# Patient Record
Sex: Female | Born: 1980 | Race: White | Hispanic: No | Marital: Married | Smoking: Former smoker
Health system: Southern US, Community
[De-identification: ages and names within clinical notes are randomized; demographics above are authoritative.]

## PROBLEM LIST (undated history)

## (undated) DIAGNOSIS — F32A Depression, unspecified: Secondary | ICD-10-CM

## (undated) DIAGNOSIS — R3915 Urgency of urination: Secondary | ICD-10-CM

## (undated) DIAGNOSIS — F419 Anxiety disorder, unspecified: Secondary | ICD-10-CM

## (undated) DIAGNOSIS — N201 Calculus of ureter: Secondary | ICD-10-CM

## (undated) DIAGNOSIS — R350 Frequency of micturition: Secondary | ICD-10-CM

## (undated) DIAGNOSIS — F329 Major depressive disorder, single episode, unspecified: Secondary | ICD-10-CM

## (undated) HISTORY — DX: Major depressive disorder, single episode, unspecified: F32.9

## (undated) HISTORY — DX: Anxiety disorder, unspecified: F41.9

## (undated) HISTORY — PX: TYMPANOSTOMY TUBE PLACEMENT: SHX32

## (undated) HISTORY — DX: Depression, unspecified: F32.A

## (undated) HISTORY — PX: INGUINAL HERNIA REPAIR: SUR1180

---
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
## 2005-04-25 ENCOUNTER — Other Ambulatory Visit: Admission: RE | Admit: 2005-04-25 | Discharge: 2005-04-25 | Payer: Self-pay | Admitting: Family Medicine

## 2006-02-14 HISTORY — PX: TONSILLECTOMY: SUR1361

## 2010-01-18 ENCOUNTER — Inpatient Hospital Stay (HOSPITAL_COMMUNITY)
Admission: AD | Admit: 2010-01-18 | Discharge: 2010-01-20 | Payer: Self-pay | Source: Home / Self Care | Attending: Obstetrics and Gynecology | Admitting: Obstetrics and Gynecology

## 2010-04-27 LAB — CBC
Hemoglobin: 9.5 g/dL — ABNORMAL LOW (ref 12.0–15.0)
MCH: 32.3 pg (ref 26.0–34.0)
MCV: 94.1 fL (ref 78.0–100.0)
Platelets: 163 10*3/uL (ref 150–400)
Platelets: 189 10*3/uL (ref 150–400)
RBC: 2.93 MIL/uL — ABNORMAL LOW (ref 3.87–5.11)
RBC: 3.87 MIL/uL (ref 3.87–5.11)
RDW: 14.6 % (ref 11.5–15.5)
WBC: 12.4 10*3/uL — ABNORMAL HIGH (ref 4.0–10.5)
WBC: 9.3 10*3/uL (ref 4.0–10.5)

## 2010-04-27 LAB — RPR: RPR Ser Ql: NONREACTIVE

## 2011-07-01 ENCOUNTER — Telehealth: Payer: Self-pay | Admitting: Obstetrics and Gynecology

## 2011-07-02 NOTE — Telephone Encounter (Signed)
Error

## 2012-02-09 ENCOUNTER — Ambulatory Visit (INDEPENDENT_AMBULATORY_CARE_PROVIDER_SITE_OTHER): Payer: BC Managed Care – PPO | Admitting: Emergency Medicine

## 2012-02-09 VITALS — BP 100/62 | HR 64 | Temp 98.3°F | Resp 16 | Ht 63.5 in | Wt 122.4 lb

## 2012-02-09 DIAGNOSIS — IMO0002 Reserved for concepts with insufficient information to code with codable children: Secondary | ICD-10-CM

## 2012-02-09 DIAGNOSIS — M79609 Pain in unspecified limb: Secondary | ICD-10-CM

## 2012-02-09 MED ORDER — MUPIROCIN CALCIUM 2 % EX CREA: TOPICAL_CREAM | Freq: Three times a day (TID) | CUTANEOUS | Status: DC

## 2012-02-09 MED ORDER — CEPHALEXIN 500 MG PO TABS: 500.0000 mg | ORAL_TABLET | Freq: Two times a day (BID) | ORAL | Status: DC

## 2012-02-09 NOTE — Progress Notes (Signed)
Verbal consent obtained.  Metacarpal block with 3.5 cc 2% lidocaine plain.  SP&D.  Granulomatous tissue removed with 15 blade.  No purulence.  Hemostasis achieved with pressure.  Cleansed.  Dressing applied.

## 2012-02-09 NOTE — Progress Notes (Signed)
  Subjective:    Patient ID: Christina [name], female    DOB: 1980/08/21, 31 y.o.   MRN: [id number]  HPI Pt presents to clinic today with infection of her Rt index finger. She states that she gets finger infections frequently because she pulls at her cuticles.    Review of Systems     Objective:   Physical Exam examination reveals a 2 x 4 mm developing granuloma at the base of the nail. There is minimal surrounding soft tissue swelling in this area.       Assessment & Plan:  Patient here with a finger paronychia with secondary hygienic granuloma. The tissue will be removed after anesthesia. We'll treat with Bactroban and cephalexin. Wound culture has been ordered .

## 2012-02-09 NOTE — Patient Instructions (Addendum)
Paronychia  Paronychia is an inflammatory reaction involving the folds of the skin surrounding the fingernail. This is commonly caused by an infection in the skin around a nail. The most common cause of paronychia is frequent wetting of the hands (as seen with bartenders, food servers, nurses or others who wet their hands). This makes the skin around the fingernail susceptible to infection by bacteria (germs) or fungus. Other predisposing factors are:   Aggressive manicuring.   Nail biting.   Thumb sucking.  The most common cause is a staphylococcal (a type of germ) infection, or a fungal (Candida) infection. When caused by a germ, it usually comes on suddenly with redness, swelling, pus and is often painful. It may get under the nail and form an abscess (collection of pus), or form an abscess around the nail. If the nail itself is infected with a fungus, the treatment is usually prolonged and may require oral medicine for up to one year. Your caregiver will determine the length of time treatment is required. The paronychia caused by bacteria (germs) may largely be avoided by not pulling on hangnails or picking at cuticles. When the infection occurs at the tips of the finger it is called felon. When the cause of paronychia is from the herpes simplex virus (HSV) it is called herpetic whitlow.  TREATMENT   When an abscess is present treatment is often incision and drainage. This means that the abscess must be cut open so the pus can get out. When this is done, the following home care instructions should be followed.  HOME CARE INSTRUCTIONS    It is important to keep the affected fingers very dry. Rubber or plastic gloves over cotton gloves should be used whenever the hand must be placed in water.   Keep wound clean, dry and dressed as suggested by your caregiver between warm soaks or warm compresses.   Soak in warm water for fifteen to twenty minutes three to four times per day for bacterial infections. Fungal  infections are very difficult to treat, so often require treatment for long periods of time.   For bacterial (germ) infections take antibiotics (medicine which kill germs) as directed and finish the prescription, even if the problem appears to be solved before the medicine is gone.   Only take over-the-counter or prescription medicines for pain, discomfort, or fever as directed by your caregiver.  SEEK IMMEDIATE MEDICAL CARE IF:   You have redness, swelling, or increasing pain in the wound.   You notice pus coming from the wound.   You have a fever.   You notice a bad smell coming from the wound or dressing.  Document Released: 07/27/2000 Document Revised: 04/25/2011 Document Reviewed: 03/28/2008  [employer] Patient Information 2013 [employer].

## 2012-02-09 NOTE — Progress Notes (Deleted)
  Subjective:    Patient ID: Christina [name], female    DOB: 1980/11/09, 31 y.o.   MRN: [id number]  HPI    Review of Systems     Objective:   Physical Exam        Assessment & Plan:

## 2012-02-10 ENCOUNTER — Encounter: Payer: Self-pay | Admitting: Emergency Medicine

## 2012-02-11 LAB — WOUND CULTURE: Gram Stain: NONE SEEN

## 2012-03-13 ENCOUNTER — Emergency Department (HOSPITAL_COMMUNITY): Payer: BC Managed Care – PPO

## 2012-03-13 ENCOUNTER — Emergency Department (HOSPITAL_COMMUNITY)
Admission: EM | Admit: 2012-03-13 | Discharge: 2012-03-13 | Disposition: A | Discharge status: Home / Self Care | Payer: BC Managed Care – PPO | Attending: Emergency Medicine | Admitting: Emergency Medicine

## 2012-03-13 ENCOUNTER — Encounter (HOSPITAL_COMMUNITY): Payer: Self-pay | Admitting: *Deleted

## 2012-03-13 DIAGNOSIS — Z3202 Encounter for pregnancy test, result negative: Secondary | ICD-10-CM | POA: Insufficient documentation

## 2012-03-13 DIAGNOSIS — N1330 Unspecified hydronephrosis: Secondary | ICD-10-CM | POA: Insufficient documentation

## 2012-03-13 DIAGNOSIS — R300 Dysuria: Secondary | ICD-10-CM | POA: Insufficient documentation

## 2012-03-13 DIAGNOSIS — R197 Diarrhea, unspecified: Secondary | ICD-10-CM | POA: Insufficient documentation

## 2012-03-13 DIAGNOSIS — R112 Nausea with vomiting, unspecified: Secondary | ICD-10-CM | POA: Insufficient documentation

## 2012-03-13 DIAGNOSIS — F411 Generalized anxiety disorder: Secondary | ICD-10-CM | POA: Insufficient documentation

## 2012-03-13 DIAGNOSIS — N132 Hydronephrosis with renal and ureteral calculous obstruction: Secondary | ICD-10-CM

## 2012-03-13 DIAGNOSIS — N898 Other specified noninflammatory disorders of vagina: Secondary | ICD-10-CM | POA: Insufficient documentation

## 2012-03-13 DIAGNOSIS — Z79899 Other long term (current) drug therapy: Secondary | ICD-10-CM | POA: Insufficient documentation

## 2012-03-13 DIAGNOSIS — N201 Calculus of ureter: Secondary | ICD-10-CM | POA: Insufficient documentation

## 2012-03-13 DIAGNOSIS — Z87891 Personal history of nicotine dependence: Secondary | ICD-10-CM | POA: Insufficient documentation

## 2012-03-13 LAB — URINALYSIS, ROUTINE W REFLEX MICROSCOPIC
Bilirubin Urine: NEGATIVE
Glucose, UA: NEGATIVE mg/dL
Ketones, ur: 40 mg/dL — AB
Protein, ur: 30 mg/dL — AB
Urobilinogen, UA: 0.2 mg/dL (ref 0.0–1.0)

## 2012-03-13 LAB — COMPREHENSIVE METABOLIC PANEL
Albumin: 4.3 g/dL (ref 3.5–5.2)
BUN: 15 mg/dL (ref 6–23)
CO2: 25 mEq/L (ref 19–32)
Calcium: 8.7 mg/dL (ref 8.4–10.5)
Chloride: 104 mEq/L (ref 96–112)
Creatinine, Ser: 0.85 mg/dL (ref 0.50–1.10)
GFR calc non Af Amer: >90 mL/min (ref >90)
Total Bilirubin: 0.6 mg/dL (ref 0.3–1.2)

## 2012-03-13 LAB — CBC WITH DIFFERENTIAL/PLATELET
Basophils Absolute: 0 10*3/uL (ref 0.0–0.1)
Basophils Relative: 0 % (ref 0–1)
Eosinophils Relative: 0 % (ref 0–5)
HCT: 37.7 % (ref 36.0–46.0)
Hemoglobin: 12.8 g/dL (ref 12.0–15.0)
MCHC: 34 g/dL (ref 30.0–36.0)
MCV: 89.8 fL (ref 78.0–100.0)
Monocytes Absolute: 0.4 10*3/uL (ref 0.1–1.0)
Monocytes Relative: 5 % (ref 3–12)
Neutro Abs: 6.8 10*3/uL (ref 1.7–7.7)
RDW: 14.3 % (ref 11.5–15.5)

## 2012-03-13 LAB — URINE MICROSCOPIC-ADD ON

## 2012-03-13 LAB — WET PREP, GENITAL: Yeast Wet Prep HPF POC: NONE SEEN

## 2012-03-13 LAB — PREGNANCY, URINE: Preg Test, Ur: NEGATIVE

## 2012-03-13 MED ORDER — HYDROMORPHONE HCL PF 1 MG/ML IJ SOLN
0.5000 mg | Freq: Once | INTRAMUSCULAR | Status: AC
  Administered 2012-03-13: 0.5 mg via INTRAVENOUS
  Filled 2012-03-13: qty 1

## 2012-03-13 MED ORDER — SODIUM CHLORIDE 0.9 % IV SOLN
INTRAVENOUS | Status: DC
  Administered 2012-03-13: 18:00:00 via INTRAVENOUS

## 2012-03-13 MED ORDER — ONDANSETRON HCL 4 MG/2ML IJ SOLN
4.0000 mg | Freq: Once | INTRAMUSCULAR | Status: AC
  Administered 2012-03-13: 4 mg via INTRAVENOUS
  Filled 2012-03-13: qty 2

## 2012-03-13 MED ORDER — IBUPROFEN 600 MG PO TABS: 600.0000 mg | ORAL_TABLET | Freq: Four times a day (QID) | ORAL | Status: DC | PRN

## 2012-03-13 MED ORDER — SODIUM CHLORIDE 0.9 % IV BOLUS (SEPSIS)
500.0000 mL | Freq: Once | INTRAVENOUS | Status: AC
  Administered 2012-03-13: 500 mL via INTRAVENOUS

## 2012-03-13 MED ORDER — OXYCODONE-ACETAMINOPHEN 5-325 MG PO TABS: 1.0000 | ORAL_TABLET | Freq: Four times a day (QID) | ORAL | Status: DC | PRN

## 2012-03-13 MED ORDER — TAMSULOSIN HCL 0.4 MG PO CAPS: 0.4000 mg | ORAL_CAPSULE | Freq: Every day | ORAL | Status: DC

## 2012-03-13 MED ORDER — SODIUM CHLORIDE 0.9 % IV BOLUS (SEPSIS)
500.0000 mL | Freq: Once | INTRAVENOUS | Status: AC
  Administered 2012-03-13: 18:00:00 via INTRAVENOUS

## 2012-03-13 MED ORDER — ONDANSETRON 8 MG PO TBDP: 8.0000 mg | ORAL_TABLET | Freq: Three times a day (TID) | ORAL | Status: DC | PRN

## 2012-03-13 NOTE — ED Provider Notes (Signed)
History     CSN: [id number]  Arrival date & time 03/13/12  1633   First MD Initiated Contact with Patient 03/13/12 1644      Chief Complaint  Patient presents with  . Abdominal Pain    (Consider location/radiation/quality/duration/timing/severity/associated sxs/prior treatment) HPI Comments: Patient presents with acute onset of left lower quadrant abdominal pain this morning. Patient describes the pain as waxing and waning, sharp and was initially intermittent. It did not radiate. It has since become more constant. It has not been associated with nausea, vomiting, diarrhea, dysuria. Patient states she is currently on her menstrual period. She does not have any history of surgeries. She took over-the-counter medication at home prior to arrival without any relief. Course is constant. Palpation and movement makes the pain worse. Nothing makes it better.  The history is provided by the patient.    Past Medical History  Diagnosis Date  . Anxiety     Past Surgical History  Procedure Date  . Hernia repair   . Tonsilectomy, adenoidectomy, bilateral myringotomy and tubes     Family History  Problem Relation Age of Onset  . Hypertension Mother     History  Substance Use Topics  . Smoking status: Former Games developer  . Smokeless tobacco: Not on file  . Alcohol Use: No    OB History    Grav Para Term Preterm Abortions TAB SAB Ect Mult Living                  Review of Systems  Constitutional: Negative for fever.  HENT: Negative for sore throat and rhinorrhea.   Eyes: Negative for redness.  Respiratory: Negative for cough.   Cardiovascular: Negative for chest pain.  Gastrointestinal: Positive for abdominal pain. Negative for nausea, vomiting and diarrhea.  Genitourinary: Positive for vaginal bleeding. Negative for dysuria, hematuria, flank pain and vaginal discharge.  Musculoskeletal: Negative for myalgias.  Skin: Negative for rash.  Neurological: Negative for headaches.     Allergies  Codeine and Dimetapp c  Home Medications   Current Outpatient Rx  Name  Route  Sig  Dispense  Refill  . SERTRALINE HCL 25 MG PO TABS   Oral   Take 25 mg by mouth daily.           BP 98/63  Pulse 48  Temp 98.2 F (36.8 C) (Oral)  SpO2 100%  LMP 03/13/2012  Physical Exam  Nursing note and vitals reviewed. Constitutional: She appears well-developed and well-nourished.  HENT:  Head: Normocephalic and atraumatic.  Eyes: Conjunctivae normal are normal. Right eye exhibits no discharge. Left eye exhibits no discharge.  Neck: Normal range of motion. Neck supple.  Cardiovascular: Normal rate, regular rhythm and normal heart sounds.   Pulmonary/Chest: Effort normal and breath sounds normal.  Abdominal: Soft. Bowel sounds are normal. There is tenderness. There is no rigidity, no rebound, no guarding, no CVA tenderness, no tenderness at McBurney's point and negative Murphy's sign.    Genitourinary: Pelvic exam was performed with patient supine. There is no rash or tenderness on the right labia. There is no rash or tenderness on the left labia. Uterus is not tender. Cervix exhibits no motion tenderness and no discharge. Right adnexum displays no mass and no tenderness. Left adnexum displays no mass and no tenderness. There is bleeding (mild) around the vagina. No tenderness around the vagina. No vaginal discharge found.  Neurological: She is alert.  Skin: Skin is warm and dry.  Psychiatric: She has a normal mood and  affect.    ED Course  Procedures (including critical care time)  Labs Reviewed  URINALYSIS, ROUTINE W REFLEX MICROSCOPIC - Abnormal; Notable for the following:    Specific Gravity, Urine 1.031 (*)     Hgb urine dipstick LARGE (*)     Ketones, ur 40 (*)     Protein, ur 30 (*)     All other components within normal limits  CBC WITH DIFFERENTIAL - Abnormal; Notable for the following:    Neutrophils Relative 85 (*)     Lymphocytes Relative 10 (*)      All other components within normal limits  COMPREHENSIVE METABOLIC PANEL - Abnormal; Notable for the following:    Glucose, Bld 111 (*)     All other components within normal limits  WET PREP, GENITAL - Abnormal; Notable for the following:    WBC, Wet Prep HPF POC FEW (*)     All other components within normal limits  URINE MICROSCOPIC-ADD ON - Abnormal; Notable for the following:    Squamous Epithelial / LPF FEW (*)     Casts GRANULAR CAST (*)     All other components within normal limits  PREGNANCY, URINE  URINALYSIS, ROUTINE W REFLEX MICROSCOPIC  GC/CHLAMYDIA PROBE AMP   Ct Abdomen Pelvis Wo Contrast  03/13/2012  *RADIOLOGY REPORT*  Clinical Data: Left flank pain.  Evaluate for stone.  CT ABDOMEN AND PELVIS WITHOUT CONTRAST  Technique:  Multidetector CT imaging of the abdomen and pelvis was performed following the standard protocol without intravenous contrast.  Comparison: None.  Findings: Clear lung bases.  Unenhanced appearance of the liver, spleen, pancreas, gallbladder, and adrenal glands is normal. Normal appearing right kidney.  Marked left hydronephrosis and hydroureter secondary to a partially obstructing 3 x 5 mm distal ureteral calculus (image 77).  Other calcific densities likely represent phleboliths although a second even more distal ureteral stone (image 80) cannot completely be excluded.  No bladder calculi.  Unremarkable osseous structures.  Normal appendix.  No stomach, small bowel, or colon abnormality.  IMPRESSION: Marked left hydronephrosis and hydroureter secondary to a partially obstructing 3 x 5 mm distal ureteral calculus.  See comments above.   Original Report Authenticated By: Davonna Belling, M.D.      1. Ureteral stone with hydronephrosis     5:10 PM Patient seen and examined. Work-up initiated. Medications ordered.   Vital signs reviewed and are as follows: Filed Vitals:   03/13/12 1640  BP: 98/63  Pulse: 48  Temp: 98.2 F (36.8 C)   6:06 PM Pelvic  performed with nurse chaperone. Patient had tenderness with speculum exam, but no tenderness with bimanual.  Given no pelvic tenderness, will order CT to r/o kidney stone.   9:04 PM CT reviewed by myself. Pt informed of results. Patient counseled on kidney stone treatment. Urged patient to strain urine and save any stones. Urged urology follow-up and return to [facility] with any complications. Counseled patient to maintain good fluid intake.   Counseled patient on use of flomax.   Patient counseled on use of narcotic pain medications. Counseled not to combine these medications with others containing tylenol. Urged not to drink alcohol, drive, or perform any other activities that requires focus while taking these medications. The patient verbalizes understanding and agrees with the plan.  Patient appears well, up walking in ED.      MDM  Renal stone with hydro. Pain well controlled in emergency department. She appears well. Tolerating fluids. Kidney function is normal. Urology followup given.  Christina [name], [place] 03/13/12 2106

## 2012-03-13 NOTE — ED Notes (Signed)
Lab sts not enough urine for UA

## 2012-03-13 NOTE — ED Notes (Signed)
Lt lower abd pain since this am with nv and no diarrhea.  lmp now

## 2012-03-13 NOTE — ED Provider Notes (Signed)
Medical screening examination/treatment/procedure(s) were performed by non-physician practitioner and as supervising physician I was immediately available for consultation/collaboration.   Gilda Crease, MD 03/13/12 2300

## 2012-03-13 NOTE — ED Notes (Signed)
Pt c/o lightheadedness during IV dilaudid administration. BP 95/72. 0.25mg  pushed over 2 min. Paused administration BP recheck 102/64 completed 0.5mg  dosage.  Pt sts she no longer feels lightheaded

## 2012-03-13 NOTE — ED Notes (Signed)
PA at bedside.

## 2012-03-14 LAB — GC/CHLAMYDIA PROBE AMP
CT Probe RNA: NEGATIVE
GC Probe RNA: NEGATIVE

## 2012-03-21 ENCOUNTER — Other Ambulatory Visit: Payer: Self-pay | Admitting: Urology

## 2012-03-23 ENCOUNTER — Encounter (HOSPITAL_BASED_OUTPATIENT_CLINIC_OR_DEPARTMENT_OTHER): Payer: Self-pay | Admitting: *Deleted

## 2012-03-23 ENCOUNTER — Other Ambulatory Visit: Payer: Self-pay | Admitting: Urology

## 2012-03-23 NOTE — Progress Notes (Signed)
NPO AFTER MN WITH EXCEPTION CLEAR LIQUIDS UNTIL 0700 (NO CREAM/ MILK PRODUCTS).  CURRENTS LABS IN EPIC AND CHART. MAY TAKE OXYCODONE/ ZOFRAN IF NEEDED W/ SIPS OF WATER AM OF SURG.

## 2012-03-26 ENCOUNTER — Ambulatory Visit (HOSPITAL_COMMUNITY): Payer: BC Managed Care – PPO

## 2012-03-26 ENCOUNTER — Ambulatory Visit (HOSPITAL_BASED_OUTPATIENT_CLINIC_OR_DEPARTMENT_OTHER): Payer: BC Managed Care – PPO | Admitting: Anesthesiology

## 2012-03-26 ENCOUNTER — Ambulatory Visit (HOSPITAL_BASED_OUTPATIENT_CLINIC_OR_DEPARTMENT_OTHER)
Admission: RE | Admit: 2012-03-26 | Discharge: 2012-03-26 | Disposition: A | Discharge status: Home / Self Care | Payer: BC Managed Care – PPO | Source: Ambulatory Visit | Attending: Urology | Admitting: Urology

## 2012-03-26 ENCOUNTER — Encounter (HOSPITAL_BASED_OUTPATIENT_CLINIC_OR_DEPARTMENT_OTHER)
Admission: RE | Disposition: A | Discharge status: Home / Self Care | Payer: Self-pay | Source: Ambulatory Visit | Attending: Urology

## 2012-03-26 ENCOUNTER — Encounter (HOSPITAL_BASED_OUTPATIENT_CLINIC_OR_DEPARTMENT_OTHER): Payer: Self-pay | Admitting: Anesthesiology

## 2012-03-26 ENCOUNTER — Encounter (HOSPITAL_BASED_OUTPATIENT_CLINIC_OR_DEPARTMENT_OTHER): Payer: Self-pay | Admitting: *Deleted

## 2012-03-26 DIAGNOSIS — N201 Calculus of ureter: Secondary | ICD-10-CM | POA: Insufficient documentation

## 2012-03-26 HISTORY — DX: Frequency of micturition: R35.0

## 2012-03-26 HISTORY — PX: CYSTOSCOPY WITH RETROGRADE PYELOGRAM, URETEROSCOPY AND STENT PLACEMENT: SHX5789

## 2012-03-26 HISTORY — DX: Calculus of ureter: N20.1

## 2012-03-26 HISTORY — DX: Urgency of urination: R39.15

## 2012-03-26 HISTORY — PX: HOLMIUM LASER APPLICATION: SHX5852

## 2012-03-26 HISTORY — PX: STONE EXTRACTION WITH BASKET: SHX5318

## 2012-03-26 LAB — POCT PREGNANCY, URINE: Preg Test, Ur: NEGATIVE

## 2012-03-26 SURGERY — CYSTOURETEROSCOPY, WITH RETROGRADE PYELOGRAM AND STENT INSERTION
Anesthesia: General | Site: Ureter | Laterality: Left | Wound class: Clean Contaminated

## 2012-03-26 MED ORDER — SUCCINYLCHOLINE CHLORIDE 20 MG/ML IJ SOLN
INTRAMUSCULAR | Status: DC | PRN
  Administered 2012-03-26 (×3): 20 mg via INTRAVENOUS
  Administered 2012-03-26: 80 mg via INTRAVENOUS

## 2012-03-26 MED ORDER — PROMETHAZINE HCL 25 MG/ML IJ SOLN
6.2500 mg | INTRAMUSCULAR | Status: DC | PRN
  Filled 2012-03-26: qty 1

## 2012-03-26 MED ORDER — CEFAZOLIN SODIUM-DEXTROSE 2-3 GM-% IV SOLR
INTRAVENOUS | Status: DC | PRN
  Administered 2012-03-26: 2 g via INTRAVENOUS

## 2012-03-26 MED ORDER — BELLADONNA ALKALOIDS-OPIUM 16.2-60 MG RE SUPP
RECTAL | Status: DC | PRN
  Administered 2012-03-26: 1 via RECTAL

## 2012-03-26 MED ORDER — DEXAMETHASONE SODIUM PHOSPHATE 4 MG/ML IJ SOLN
INTRAMUSCULAR | Status: DC | PRN
  Administered 2012-03-26: 10 mg via INTRAVENOUS

## 2012-03-26 MED ORDER — HYOSCYAMINE SULFATE 0.125 MG PO TABS
0.1250 mg | ORAL_TABLET | ORAL | Status: DC | PRN
Start: 2012-03-26 — End: 2013-12-17

## 2012-03-26 MED ORDER — CEFAZOLIN SODIUM-DEXTROSE 2-3 GM-% IV SOLR
2.0000 g | INTRAVENOUS | Status: DC
  Filled 2012-03-26: qty 50

## 2012-03-26 MED ORDER — CEPHALEXIN 500 MG PO CAPS: 500.0000 mg | ORAL_CAPSULE | Freq: Three times a day (TID) | ORAL | Status: DC

## 2012-03-26 MED ORDER — LACTATED RINGERS IV SOLN
INTRAVENOUS | Status: DC
  Filled 2012-03-26: qty 1000

## 2012-03-26 MED ORDER — ONDANSETRON HCL 4 MG/2ML IJ SOLN
INTRAMUSCULAR | Status: DC | PRN
  Administered 2012-03-26: 4 mg via INTRAVENOUS

## 2012-03-26 MED ORDER — LIDOCAINE HCL (CARDIAC) 20 MG/ML IV SOLN
INTRAVENOUS | Status: DC | PRN
  Administered 2012-03-26: 100 mg via INTRAVENOUS

## 2012-03-26 MED ORDER — IOHEXOL 350 MG/ML SOLN
INTRAVENOUS | Status: DC | PRN
  Administered 2012-03-26: 5 mL

## 2012-03-26 MED ORDER — LACTATED RINGERS IV SOLN
INTRAVENOUS | Status: DC
  Administered 2012-03-26 (×2): via INTRAVENOUS
  Administered 2012-03-26: 100 mL/h via INTRAVENOUS
  Filled 2012-03-26: qty 1000

## 2012-03-26 MED ORDER — SENNOSIDES-DOCUSATE SODIUM 8.6-50 MG PO TABS: 1.0000 | ORAL_TABLET | Freq: Two times a day (BID) | ORAL | Status: DC

## 2012-03-26 MED ORDER — PROPOFOL 10 MG/ML IV BOLUS
INTRAVENOUS | Status: DC | PRN
  Administered 2012-03-26: 170 mg via INTRAVENOUS
  Administered 2012-03-26: 30 mg via INTRAVENOUS

## 2012-03-26 MED ORDER — LIDOCAINE HCL 2 % EX GEL
CUTANEOUS | Status: DC | PRN
  Administered 2012-03-26: 1 via URETHRAL

## 2012-03-26 MED ORDER — KETOROLAC TROMETHAMINE 30 MG/ML IJ SOLN
INTRAMUSCULAR | Status: DC | PRN
  Administered 2012-03-26: 30 mg via INTRAVENOUS

## 2012-03-26 MED ORDER — GLYCOPYRROLATE 0.2 MG/ML IJ SOLN
INTRAMUSCULAR | Status: DC | PRN
  Administered 2012-03-26: 0.2 mg via INTRAVENOUS

## 2012-03-26 MED ORDER — PHENAZOPYRIDINE HCL 100 MG PO TABS: 100.0000 mg | ORAL_TABLET | Freq: Three times a day (TID) | ORAL | Status: DC | PRN

## 2012-03-26 MED ORDER — FENTANYL CITRATE 0.05 MG/ML IJ SOLN
INTRAMUSCULAR | Status: DC | PRN
  Administered 2012-03-26: 25 ug via INTRAVENOUS
  Administered 2012-03-26: 50 ug via INTRAVENOUS
  Administered 2012-03-26 (×3): 25 ug via INTRAVENOUS
  Administered 2012-03-26: 50 ug via INTRAVENOUS

## 2012-03-26 MED ORDER — FENTANYL CITRATE 0.05 MG/ML IJ SOLN
25.0000 ug | INTRAMUSCULAR | Status: DC | PRN
  Filled 2012-03-26: qty 1

## 2012-03-26 MED ORDER — OXYBUTYNIN CHLORIDE 5 MG PO TABS: 5.0000 mg | ORAL_TABLET | Freq: Four times a day (QID) | ORAL | Status: DC | PRN

## 2012-03-26 MED ORDER — SODIUM CHLORIDE 0.9 % IR SOLN
Status: DC | PRN
  Administered 2012-03-26: 6000 mL via INTRAVESICAL

## 2012-03-26 MED ORDER — MIDAZOLAM HCL 5 MG/5ML IJ SOLN
INTRAMUSCULAR | Status: DC | PRN
  Administered 2012-03-26 (×2): 2 mg via INTRAVENOUS

## 2012-03-26 SURGICAL SUPPLY — 46 items
ADAPTER CATH URET PLST 4-6FR (CATHETERS) IMPLANT
ADPR CATH URET STRL DISP 4-6FR (CATHETERS)
BAG DRAIN URO-CYSTO SKYTR STRL (DRAIN) ×2 IMPLANT
BAG DRN UROCATH (DRAIN) ×1
BASKET LASER NITINOL 1.9FR (BASKET) IMPLANT
BASKET STNLS GEMINI 4WIRE 3FR (BASKET) IMPLANT
BASKET ZERO TIP NITINOL 2.4FR (BASKET) ×1 IMPLANT
BRUSH URET BIOPSY 3F (UROLOGICAL SUPPLIES) IMPLANT
BSKT STON RTRVL 120 1.9FR (BASKET)
BSKT STON RTRVL GEM 120X11 3FR (BASKET)
BSKT STON RTRVL ZERO TP 2.4FR (BASKET) ×1
CANISTER SUCT LVC 12 LTR MEDI- (MISCELLANEOUS) ×1 IMPLANT
CATH CLEAR GEL 3F BACKSTOP (CATHETERS) ×1 IMPLANT
CATH INTERMIT  6FR 70CM (CATHETERS) ×1 IMPLANT
CATH URET 5FR 28IN CONE TIP (BALLOONS)
CATH URET 5FR 28IN OPEN ENDED (CATHETERS) ×1 IMPLANT
CATH URET 5FR 70CM CONE TIP (BALLOONS) IMPLANT
CATH URET DUAL LUMEN 6-10FR 50 (CATHETERS) IMPLANT
CLOTH BEACON ORANGE TIMEOUT ST (SAFETY) ×2 IMPLANT
DRAPE CAMERA CLOSED 9X96 (DRAPES) ×2 IMPLANT
ELECT REM PT RETURN 9FT ADLT (ELECTROSURGICAL)
ELECTRODE REM PT RTRN 9FT ADLT (ELECTROSURGICAL) IMPLANT
GLOVE BIO SURGEON STRL SZ7 (GLOVE) ×2 IMPLANT
GLOVE ECLIPSE 7.0 STRL STRAW (GLOVE) ×2 IMPLANT
GLOVE INDICATOR 7.5 STRL GRN (GLOVE) ×2 IMPLANT
GOWN PREVENTION PLUS LG XLONG (DISPOSABLE) ×2 IMPLANT
GOWN SURGICAL LARGE (GOWNS) ×1 IMPLANT
GOWN SURGICAL XLG (GOWNS) ×1 IMPLANT
GUIDEWIRE 0.038 PTFE COATED (WIRE) IMPLANT
GUIDEWIRE ANG ZIPWIRE 038X150 (WIRE) IMPLANT
GUIDEWIRE STR DUAL SENSOR (WIRE) ×2 IMPLANT
IV NS IRRIG 3000ML ARTHROMATIC (IV SOLUTION) ×3 IMPLANT
KIT BALLIN UROMAX 15FX10 (LABEL) IMPLANT
KIT BALLN UROMAX 15FX4 (MISCELLANEOUS) IMPLANT
KIT BALLN UROMAX 26 75X4 (MISCELLANEOUS)
LASER FIBER DISP (UROLOGICAL SUPPLIES) ×1 IMPLANT
PACK CYSTOSCOPY (CUSTOM PROCEDURE TRAY) ×2 IMPLANT
SET HIGH PRES BAL DIL (LABEL)
SHEATH ACCESS URETERAL 24CM (SHEATH) ×1 IMPLANT
SHEATH ACCESS URETERAL 38CM (SHEATH) IMPLANT
SHEATH ACCESS URETERAL 54CM (SHEATH) IMPLANT
SHEATH URET ACCESS 12FR/35CM (UROLOGICAL SUPPLIES) IMPLANT
SHEATH URET ACCESS 12FR/55CM (UROLOGICAL SUPPLIES) IMPLANT
STENT CONTOUR 6FRX24X.038 (STENTS) IMPLANT
STENT URET 6FRX24 CONTOUR (STENTS) ×1 IMPLANT
SYRINGE IRR TOOMEY STRL 70CC (SYRINGE) IMPLANT

## 2012-03-26 NOTE — H&P (Signed)
Urology History and Physical Exam  CC: Left ureter stone  HPI: 32 year old female presents today for a left ureter stone. She began having abdominal pain 03/13/12. This was located in the left lower quadrant. It did not radiate to the back or the groin. It was dull in nature. It was associated with nausea. It was not associated with fever or emesis. Nothing seems to make it better or worse. She's never had any genitourinary surgery. Her last menstrual period began on Monday, 03/19/12. She reported to the ER on 03/13/12. She was evaluated and found to have a left ureter stone. This is in the distal ureter. It was approximately 3 x 5 mm in size. She had no signs or symptoms of infection in the ER. It is possible that she could have a second more distal left ureter stone, but this is difficult to evaluate on the CT scan. The stone was associated with marked left hydronephrosis and hydroureter. The stone was only partially obstructing.  Initially, she wanted to try to pass the stone, but contacted my office and decided to schedule intervention. She presents today for cystoscopy, left retrograde pyelogram, left ureteroscopy, laser lithotripsy, possible left ureter stent placement.  KUB was obtained in clinic showed several pulsations the left pelvis. One of these is likely her left ureter stone. The patient's symptoms have improved, but she does not know if she passed the stone. A KUB was obtained today which showed the stone still appears to be present in the left distal ureter. I offered to proceed with surgery vs obtaining a CT scan to evaluate for the stone. She wishes to proceed.   PMH: Past Medical History  Diagnosis Date  . Anxiety   . Left ureteral calculus   . Frequency of urination   . Urgency of urination     PSH: Past Surgical History  Procedure Laterality Date  . Inguinal hernia repair  AS CHILD  . Tympanostomy tube placement  23 MONTH OLD    REMOVAL AT AGE 80  . Tonsillectomy  2008     Allergies: Allergies  Allergen Reactions  . Codeine Other (See Comments)    shaking  . Dimetapp C (Phenylephrine-Bromphen-Codeine)     unknown    Medications: No prescriptions prior to admission     Social History: History   Social History  . Marital Status: Married    Spouse Name: N/A    Number of Children: N/A  . Years of Education: N/A   Occupational History  . Not on file.   Social History Main Topics  . Smoking status: Former Smoker -- 0.25 packs/day for 10 years    Types: Cigarettes    Quit date: 03/24/2007  . Smokeless tobacco: Never Used  . Alcohol Use: No  . Drug Use: No  . Sexually Active: Yes    Birth Control/ Protection: Condom   Other Topics Concern  . Not on file   Social History Narrative  . No narrative on file    Family History: Family History  Problem Relation Age of Onset  . Hypertension Mother     Review of Systems: Positive: none. Negative: Chest pain, fever, or SOB.  A further 10 point review of systems was negative except what is listed in the HPI.  Physical Exam: Filed Vitals:   03/26/12 1247  BP: 94/52  Pulse: 54  Temp: 98 F (36.7 C)  Resp: 15    General: No acute distress.  Awake. Head:  Normocephalic.  Atraumatic.  ENT:  EOMI.  Mucous membranes moist Neck:  Supple.  No lymphadenopathy. CV:  S1 present. S2 present. Regular rate. Pulmonary: Equal effort bilaterally.  Clear to auscultation bilaterally. Abdomen: Soft.  Non- tender to palpation. Skin:  Normal turgor.  No visible rash. Extremity: No gross deformity of bilateral upper extremities.  No gross deformity of    bilateral lower extremities. Neurologic: Alert. Appropriate mood.    Studies:  No results found for this basename: HGB, WBC, PLT,  in the last 72 hours  No results found for this basename: NA, K, CL, CO2, BUN, CREATININE, CALCIUM, MAGNESIUM, GFRNONAA, GFRAA,  in the last 72 hours   No results found for this basename: PT, INR, APTT,  in the  last 72 hours   No components found with this basename: ABG,     Assessment:  Left distal ureter stone.  Plan: To OR for cystoscopy, left retrograde pyelogram, left ureteroscopy, laser lithotripsy, possible left ureter stent placement.

## 2012-03-26 NOTE — Anesthesia Postprocedure Evaluation (Signed)
  Anesthesia Post-op Note  Patient: Christina [name]  Procedure(s) Performed: Procedure(s) (LRB): CYSTOSCOPY WITH RETROGRADE PYELOGRAM, URETEROSCOPY AND STENT PLACEMENT (Left) HOLMIUM LASER APPLICATION (Left) STONE EXTRACTION WITH BASKET (Left)  Patient Location: PACU  Anesthesia Type: General  Level of Consciousness: awake and alert   Airway and Oxygen Therapy: Patient Spontanous Breathing  Post-op Pain: mild  Post-op Assessment: Post-op Vital signs reviewed, Patient's Cardiovascular Status Stable, Respiratory Function Stable, Patent Airway and No signs of Nausea or vomiting  Last Vitals:  Filed Vitals:   03/26/12 1530  BP:   Pulse:   Temp: 35.8 C  Resp: 16    Post-op Vital Signs: stable   Complications: No apparent anesthesia complications

## 2012-03-26 NOTE — Anesthesia Preprocedure Evaluation (Signed)
Anesthesia Evaluation  Patient identified by MRN, date of birth, ID band Patient awake    Reviewed: Allergy & Precautions, H&P , NPO status , Patient's Chart, lab work & pertinent test results  Airway Mallampati: I TM Distance: >3 FB Neck ROM: Full    Dental  (+) Teeth Intact and Dental Advisory Given   Pulmonary neg pulmonary ROS,  breath sounds clear to auscultation  Pulmonary exam normal       Cardiovascular negative cardio ROS  Rhythm:Regular Rate:Normal     Neuro/Psych negative neurological ROS  negative psych ROS   GI/Hepatic negative GI ROS, Neg liver ROS,   Endo/Other  negative endocrine ROS  Renal/GU negative Renal ROS  negative genitourinary   Musculoskeletal negative musculoskeletal ROS (+)   Abdominal   Peds  Hematology negative hematology ROS (+)   Anesthesia Other Findings   Reproductive/Obstetrics negative OB ROS                           Anesthesia Physical Anesthesia Plan  ASA: I  Anesthesia Plan: General   Post-op Pain Management:    Induction: Intravenous  Airway Management Planned: LMA  Additional Equipment:   Intra-op Plan:   Post-operative Plan: Extubation in OR  Informed Consent: I have reviewed the patients History and Physical, chart, labs and discussed the procedure including the risks, benefits and alternatives for the proposed anesthesia with the patient or authorized representative who has indicated his/her understanding and acceptance.   Dental advisory given  Plan Discussed with: CRNA  Anesthesia Plan Comments:         Anesthesia Quick Evaluation

## 2012-03-26 NOTE — Op Note (Signed)
Urology Operative Report  Date of Procedure: 03/26/12  Surgeon: Natalia Leatherwood, MD Assistant: None  Preoperative Diagnosis: Left distal ureter stone Postoperative Diagnosis:  Same  Procedure(s): Cystoscopy Left ureteroscopy Laser lithotripsy Basket stone retrieval Left ureter stent placement Left retrograde pyelogram.  Estimated blood loss: Minimal  Specimen: Stone fragments provided to patient.  Drains: None  Complications: None  Findings: Left distal ureter narrowing. Left impacted distal ureter stone.  History of present illness: Is for left distal ureter stone. This was discovered on CT scan. Over the weekend she had decreased symptoms and was concerned that she may have passed her stone, but she never actually saw the fragment. Preoperative KUB showed the stone appeared to be unmoved in position. She wished to proceed with the surgery.    Procedure in detail: After informed consent was obtained, the patient was taken to the operating room. They were placed in the supine position. SCDs were turned on and in place. IV antibiotics were infused, and general anesthesia was induced. A timeout was performed in which the correct patient, surgical site, and procedure were identified and agreed upon by the team.  The patient was placed in a dorsolithotomy position, making sure to pad all pertinent neurovascular pressure points. The genitals were prepped and draped in the usual sterile fashion.  A rigid cystoscope was advanced through the urethra and into the bladder. The bladder was fully distended and evaluated in a systematic fashion with a 12 and 70 lens. There were no lesions noted in the bladder. Attention was turned to the left ureter orifice. It was seen to be effluxing clear yellow urine. I obtained fluoroscopy of the pelvis which showed a calcification in the left pelvis which seemed to correspond with her left distal ureter stone. I then obtained a retrograde pyelogram by  catheterizing the left ureter orifice and I injected contrast. The contrast went up the ureter and surrounded the calcification showing a filling defect. There were no proximal filling defects of the ureter or collecting system. This confirmed that the stone was present in the distal ureter. I then placed a sensor tip wire up the ureter and into the left renal pelvis on fluoroscopy. The bladder was drained.  The patient was then paralyzed and I inserted a semirigid ureteroscope into the bladder, but I could not cannulate the left ureter orifice because it was too narrow. I removed the ureter scope and placed the internal obturator of the 12/14 ureter access sheath over the wire under fluoroscopy. I placed this up just proximal to the stone fluoroscopy with ease. The obturator was then removed and the wire left in place as a safety wire. I was then able to navigate the semirigid ureteroscope up to the left ureter stone. It appeared to be impacted in the ureter. I then injected backstop gel beyond the ureter under fluoroscopy. After this was done a 200  filament holmium laser was used to perform lithotripsy at 0.5 J and 20 Hz. The stone was fragmented into smaller pieces. I then grasped the larger pieces with a 0 tip Nitinol basket and brought these out into the bladder. There was noted to be a small mucosal disruption in the distal ureter from placing the obturator to the ureter access sheath. Because of this and the fact that the stone was impacted I decided to leave a double-J ureter stent in place. The semirigid scope was removed and the cystoscope was loaded over the safety wire. A 6 x 24 double-J stent was placed  up the wire with ease under fluoroscopy. A good curl was deployed in the left renal pelvis and a curl was seen in the bladder on direct visualization. No tethering strings remained in place. The bladder was then emptied of the stone contents and these were placed in a specimen jar. These were taken  to the patient after the surgery. Lidocaine jelly was placed into her urethra and a belladonna and opium suppository was placed into her rectum. She was placed back in a supine position and anesthesia was reversed. This completed the procedure. She was taken to the PACU in stable condition.  She'll return to clinic next week for cystoscopy and left ureter stent removal. She will receive Keflex to cover for this instrumentation.

## 2012-03-26 NOTE — Transfer of Care (Signed)
Immediate Anesthesia Transfer of Care Note  Patient: Christina [name]  Procedure(s) Performed: Procedure(s) (LRB): CYSTOSCOPY WITH RETROGRADE PYELOGRAM, URETEROSCOPY AND STENT PLACEMENT (Left) HOLMIUM LASER APPLICATION (Left) STONE EXTRACTION WITH BASKET (Left)  Patient Location: PACU  Anesthesia Type: General  Level of Consciousness:sleepy  Airway & Oxygen Therapy: Patient Spontanous Breathing and Patient connected to face mask oxygen  Post-op Assessment: Report given to PACU RN and Post -op Vital signs reviewed and stable  Post vital signs: Reviewed and stable  Complications: No apparent anesthesia complications

## 2012-03-26 NOTE — Anesthesia Procedure Notes (Signed)
Procedure Name: LMA Insertion Date/Time: 03/26/2012 2:28 PM Performed by: Jessica Priest Pre-anesthesia Checklist: Patient identified, Emergency Drugs available, Suction available and Patient being monitored Patient Re-evaluated:Patient Re-evaluated prior to inductionOxygen Delivery Method: Circle System Utilized Preoxygenation: Pre-oxygenation with 100% oxygen Intubation Type: IV induction Ventilation: Mask ventilation without difficulty LMA: LMA inserted LMA Size: 4.0 Number of attempts: 1 Airway Equipment and Method: bite block Placement Confirmation: positive ETCO2 Tube secured with: Tape Dental Injury: Teeth and Oropharynx as per pre-operative assessment

## 2012-03-27 ENCOUNTER — Encounter (HOSPITAL_BASED_OUTPATIENT_CLINIC_OR_DEPARTMENT_OTHER): Payer: Self-pay | Admitting: Urology

## 2012-03-27 NOTE — Progress Notes (Signed)
Pt D/C to home w husband via w/c without c/o pain, discomfort @ 1800. IV D/C @ 1745 without complications w infused while in Phase II. Voices no c/o of pain.

## 2013-02-14 NOTE — L&D Delivery Note (Signed)
Delivery Note At 8:29 AM a viable female was delivered via  (Presentation: ;  ).  APGAR: , ; weight  .   Placenta status:spont>>intact , .  Cord:  with the following complications: .  Cord pH: noty sent  Anesthesia: Epidural  Episiotomy:  none Lacerations:  Sec deg Suture Repair: 3.0 chromic Est. Blood Loss (mL):  400 rec'd IV pit + massage + 0.2 methergine for atony with improvement Mom to postpartum.  Baby to Couplet care / Skin to Skin.  Christina [name] M 12/17/2013, 8:41 AM

## 2013-05-07 LAB — OB RESULTS CONSOLE RUBELLA ANTIBODY, IGM: RUBELLA: IMMUNE

## 2013-05-07 LAB — OB RESULTS CONSOLE HEPATITIS B SURFACE ANTIGEN: Hepatitis B Surface Ag: NEGATIVE

## 2013-05-07 LAB — OB RESULTS CONSOLE ABO/RH: RH Type: POSITIVE

## 2013-05-07 LAB — OB RESULTS CONSOLE GC/CHLAMYDIA
Chlamydia: NEGATIVE
GC PROBE AMP, GENITAL: NEGATIVE

## 2013-05-07 LAB — OB RESULTS CONSOLE HIV ANTIBODY (ROUTINE TESTING): HIV: NONREACTIVE

## 2013-05-07 LAB — OB RESULTS CONSOLE ANTIBODY SCREEN: ANTIBODY SCREEN: NEGATIVE

## 2013-05-07 LAB — OB RESULTS CONSOLE RPR: RPR: NONREACTIVE

## 2013-05-30 ENCOUNTER — Other Ambulatory Visit (HOSPITAL_COMMUNITY): Payer: Self-pay | Admitting: Obstetrics and Gynecology

## 2013-05-30 DIAGNOSIS — IMO0002 Reserved for concepts with insufficient information to code with codable children: Secondary | ICD-10-CM

## 2013-05-30 DIAGNOSIS — Z0489 Encounter for examination and observation for other specified reasons: Secondary | ICD-10-CM

## 2013-07-11 ENCOUNTER — Ambulatory Visit (HOSPITAL_COMMUNITY): Payer: BC Managed Care – PPO

## 2013-12-13 ENCOUNTER — Telehealth (HOSPITAL_COMMUNITY): Payer: Self-pay | Admitting: *Deleted

## 2013-12-13 ENCOUNTER — Encounter (HOSPITAL_COMMUNITY): Payer: Self-pay | Admitting: *Deleted

## 2013-12-13 LAB — OB RESULTS CONSOLE GBS: STREP GROUP B AG: POSITIVE

## 2013-12-13 NOTE — Telephone Encounter (Signed)
Preadmission screen  

## 2013-12-16 ENCOUNTER — Encounter (HOSPITAL_COMMUNITY): Payer: Self-pay | Admitting: *Deleted

## 2013-12-17 ENCOUNTER — Inpatient Hospital Stay (HOSPITAL_COMMUNITY)
Admission: AD | Admit: 2013-12-17 | Discharge: 2013-12-19 | DRG: 775 | Disposition: A | Discharge status: Home / Self Care | Payer: BC Managed Care – PPO | Source: Ambulatory Visit | Attending: Obstetrics and Gynecology | Admitting: Obstetrics and Gynecology

## 2013-12-17 ENCOUNTER — Encounter (HOSPITAL_COMMUNITY): Payer: Self-pay | Admitting: *Deleted

## 2013-12-17 ENCOUNTER — Inpatient Hospital Stay (HOSPITAL_COMMUNITY): Payer: BC Managed Care – PPO | Admitting: Anesthesiology

## 2013-12-17 DIAGNOSIS — Z87891 Personal history of nicotine dependence: Secondary | ICD-10-CM | POA: Diagnosis not present

## 2013-12-17 DIAGNOSIS — Z3A40 40 weeks gestation of pregnancy: Secondary | ICD-10-CM | POA: Diagnosis present

## 2013-12-17 DIAGNOSIS — Z3483 Encounter for supervision of other normal pregnancy, third trimester: Secondary | ICD-10-CM | POA: Diagnosis present

## 2013-12-17 DIAGNOSIS — IMO0001 Reserved for inherently not codable concepts without codable children: Secondary | ICD-10-CM

## 2013-12-17 LAB — TYPE AND SCREEN
ABO/RH(D): O POS
ANTIBODY SCREEN: NEGATIVE

## 2013-12-17 LAB — CBC
HEMATOCRIT: 36.8 % (ref 36.0–46.0)
HEMOGLOBIN: 12.3 g/dL (ref 12.0–15.0)
MCH: 31.7 pg (ref 26.0–34.0)
MCHC: 33.4 g/dL (ref 30.0–36.0)
MCV: 94.8 fL (ref 78.0–100.0)
Platelets: 211 10*3/uL (ref 150–400)
RBC: 3.88 MIL/uL (ref 3.87–5.11)
RDW: 14.6 % (ref 11.5–15.5)
WBC: 10.7 10*3/uL — AB (ref 4.0–10.5)

## 2013-12-17 LAB — RPR

## 2013-12-17 LAB — POCT FERN TEST: POCT FERN TEST: POSITIVE

## 2013-12-17 LAB — ABO/RH: ABO/RH(D): O POS

## 2013-12-17 LAB — RAPID HIV SCREEN (WH-MAU): SUDS RAPID HIV SCREEN: NONREACTIVE

## 2013-12-17 MED ORDER — PENICILLIN G POTASSIUM 5000000 UNITS IJ SOLR
5.0000 10*6.[IU] | Freq: Once | INTRAMUSCULAR | Status: AC
  Administered 2013-12-17: 5 10*6.[IU] via INTRAVENOUS
  Filled 2013-12-17: qty 5

## 2013-12-17 MED ORDER — ZOLPIDEM TARTRATE 5 MG PO TABS: 5.0000 mg | ORAL_TABLET | Freq: Every evening | ORAL | Status: DC | PRN

## 2013-12-17 MED ORDER — WITCH HAZEL-GLYCERIN EX PADS: 1.0000 "application " | MEDICATED_PAD | CUTANEOUS | Status: DC | PRN

## 2013-12-17 MED ORDER — IBUPROFEN 800 MG PO TABS
800.0000 mg | ORAL_TABLET | Freq: Three times a day (TID) | ORAL | Status: DC | PRN
  Administered 2013-12-18 (×2): 800 mg via ORAL
  Filled 2013-12-17 (×3): qty 1

## 2013-12-17 MED ORDER — TETANUS-DIPHTH-ACELL PERTUSSIS 5-2.5-18.5 LF-MCG/0.5 IM SUSP: 0.5000 mL | Freq: Once | INTRAMUSCULAR | Status: DC

## 2013-12-17 MED ORDER — DIBUCAINE 1 % RE OINT: 1.0000 "application " | TOPICAL_OINTMENT | RECTAL | Status: DC | PRN

## 2013-12-17 MED ORDER — OXYCODONE-ACETAMINOPHEN 5-325 MG PO TABS: 1.0000 | ORAL_TABLET | ORAL | Status: DC | PRN

## 2013-12-17 MED ORDER — PRENATAL MULTIVITAMIN CH
1.0000 | ORAL_TABLET | Freq: Every day | ORAL | Status: DC
  Filled 2013-12-17: qty 1

## 2013-12-17 MED ORDER — SENNOSIDES-DOCUSATE SODIUM 8.6-50 MG PO TABS
2.0000 | ORAL_TABLET | ORAL | Status: DC
  Administered 2013-12-18 – 2013-12-19 (×2): 2 via ORAL
  Filled 2013-12-17 (×2): qty 2

## 2013-12-17 MED ORDER — DIPHENHYDRAMINE HCL 25 MG PO CAPS: 25.0000 mg | ORAL_CAPSULE | Freq: Four times a day (QID) | ORAL | Status: DC | PRN

## 2013-12-17 MED ORDER — EPHEDRINE 5 MG/ML INJ
10.0000 mg | INTRAVENOUS | Status: DC | PRN
  Filled 2013-12-17: qty 2

## 2013-12-17 MED ORDER — OXYTOCIN 40 UNITS IN LACTATED RINGERS INFUSION - SIMPLE MED
62.5000 mL/h | INTRAVENOUS | Status: DC
  Administered 2013-12-17: 62.5 mL/h via INTRAVENOUS
  Filled 2013-12-17: qty 1000

## 2013-12-17 MED ORDER — ONDANSETRON HCL 4 MG PO TABS: 4.0000 mg | ORAL_TABLET | ORAL | Status: DC | PRN

## 2013-12-17 MED ORDER — FENTANYL 2.5 MCG/ML BUPIVACAINE 1/10 % EPIDURAL INFUSION (WH - ANES)
INTRAMUSCULAR | Status: DC | PRN
  Administered 2013-12-17: 14 mL/h via EPIDURAL

## 2013-12-17 MED ORDER — MEASLES, MUMPS & RUBELLA VAC ~~LOC~~ INJ: 0.5000 mL | INJECTION | Freq: Once | SUBCUTANEOUS | Status: DC

## 2013-12-17 MED ORDER — LIDOCAINE HCL (PF) 1 % IJ SOLN
30.0000 mL | INTRAMUSCULAR | Status: DC | PRN
  Filled 2013-12-17: qty 30

## 2013-12-17 MED ORDER — ONDANSETRON HCL 4 MG/2ML IJ SOLN: 4.0000 mg | Freq: Four times a day (QID) | INTRAMUSCULAR | Status: DC | PRN

## 2013-12-17 MED ORDER — FLEET ENEMA 7-19 GM/118ML RE ENEM: 1.0000 | ENEMA | RECTAL | Status: DC | PRN

## 2013-12-17 MED ORDER — OXYCODONE-ACETAMINOPHEN 5-325 MG PO TABS: 2.0000 | ORAL_TABLET | ORAL | Status: DC | PRN

## 2013-12-17 MED ORDER — BISACODYL 10 MG RE SUPP: 10.0000 mg | Freq: Every day | RECTAL | Status: DC | PRN

## 2013-12-17 MED ORDER — ONDANSETRON HCL 4 MG/2ML IJ SOLN: 4.0000 mg | INTRAMUSCULAR | Status: DC | PRN

## 2013-12-17 MED ORDER — CITRIC ACID-SODIUM CITRATE 334-500 MG/5ML PO SOLN
30.0000 mL | ORAL | Status: DC | PRN
Start: 2013-12-17 — End: 2013-12-17

## 2013-12-17 MED ORDER — PHENYLEPHRINE 40 MCG/ML (10ML) SYRINGE FOR IV PUSH (FOR BLOOD PRESSURE SUPPORT)
80.0000 ug | PREFILLED_SYRINGE | INTRAVENOUS | Status: DC | PRN
  Administered 2013-12-17: 80 ug via INTRAVENOUS
  Filled 2013-12-17: qty 2

## 2013-12-17 MED ORDER — LACTATED RINGERS IV SOLN: 500.0000 mL | INTRAVENOUS | Status: DC | PRN

## 2013-12-17 MED ORDER — LANOLIN HYDROUS EX OINT: TOPICAL_OINTMENT | CUTANEOUS | Status: DC | PRN

## 2013-12-17 MED ORDER — LACTATED RINGERS IV SOLN
500.0000 mL | Freq: Once | INTRAVENOUS | Status: AC
  Administered 2013-12-17: 500 mL via INTRAVENOUS

## 2013-12-17 MED ORDER — ACETAMINOPHEN 325 MG PO TABS: 650.0000 mg | ORAL_TABLET | ORAL | Status: DC | PRN

## 2013-12-17 MED ORDER — METHYLERGONOVINE MALEATE 0.2 MG/ML IJ SOLN
INTRAMUSCULAR | Status: AC
  Administered 2013-12-17: 0.2 mg
  Filled 2013-12-17: qty 1

## 2013-12-17 MED ORDER — SIMETHICONE 80 MG PO CHEW: 80.0000 mg | CHEWABLE_TABLET | ORAL | Status: DC | PRN

## 2013-12-17 MED ORDER — DIPHENHYDRAMINE HCL 50 MG/ML IJ SOLN: 12.5000 mg | INTRAMUSCULAR | Status: DC | PRN

## 2013-12-17 MED ORDER — BENZOCAINE-MENTHOL 20-0.5 % EX AERO
1.0000 "application " | INHALATION_SPRAY | CUTANEOUS | Status: DC | PRN
  Filled 2013-12-17: qty 56

## 2013-12-17 MED ORDER — PENICILLIN G POTASSIUM 5000000 UNITS IJ SOLR
2.5000 10*6.[IU] | INTRAMUSCULAR | Status: DC
  Administered 2013-12-17: 2.5 10*6.[IU] via INTRAVENOUS
  Filled 2013-12-17 (×3): qty 2.5

## 2013-12-17 MED ORDER — OXYTOCIN BOLUS FROM INFUSION: 500.0000 mL | INTRAVENOUS | Status: DC

## 2013-12-17 MED ORDER — FENTANYL 2.5 MCG/ML BUPIVACAINE 1/10 % EPIDURAL INFUSION (WH - ANES)
14.0000 mL/h | INTRAMUSCULAR | Status: DC | PRN
  Filled 2013-12-17: qty 125

## 2013-12-17 MED ORDER — LIDOCAINE HCL (PF) 1 % IJ SOLN
INTRAMUSCULAR | Status: DC | PRN
  Administered 2013-12-17 (×2): 8 mL

## 2013-12-17 MED ORDER — LACTATED RINGERS IV SOLN
INTRAVENOUS | Status: DC
  Administered 2013-12-17: 05:00:00 via INTRAVENOUS

## 2013-12-17 MED ORDER — PHENYLEPHRINE 40 MCG/ML (10ML) SYRINGE FOR IV PUSH (FOR BLOOD PRESSURE SUPPORT)
80.0000 ug | PREFILLED_SYRINGE | INTRAVENOUS | Status: DC | PRN
  Filled 2013-12-17: qty 2
  Filled 2013-12-17: qty 10

## 2013-12-17 MED ORDER — FLEET ENEMA 7-19 GM/118ML RE ENEM: 1.0000 | ENEMA | Freq: Every day | RECTAL | Status: DC | PRN

## 2013-12-17 NOTE — Lactation Note (Signed)
This note was copied from the chart of Christina Gerrit Hallsnna [place]. Lactation Consultation Note  Per Raynelle FanningJulie RN mother does not want a lactation consultation.  P3.  Ex BF. Breastfed other two children for approx. one year each.  Patient Name: Christina [name] NWGNF'AToday's Date: 12/17/2013     Maternal Data    Feeding Feeding Type: Breast Fed Length of feed: 20 min  LATCH Score/Interventions Latch: Repeated attempts needed to sustain latch, nipple held in mouth throughout feeding, stimulation needed to elicit sucking reflex. Intervention(s): Adjust position  Audible Swallowing: Spontaneous and intermittent  Type of Nipple: Everted at rest and after stimulation  Comfort (Breast/Nipple): Soft / non-tender     Hold (Positioning): No assistance needed to correctly position infant at breast.  LATCH Score: 9  Lactation Tools Discussed/Used     Consult Status      Christina [name], Christina [name] 12/17/2013, 12:18 PM

## 2013-12-17 NOTE — H&P (Signed)
Delos Haringnna H Holzer is a 33 y.o. female presenting for labor/SROM. Maternal Medical History:  Reason for admission: Rupture of membranes and contractions.   Contractions: Onset was 6-12 hours ago.   Frequency: regular.   Perceived severity is moderate.    Fetal activity: Perceived fetal activity is normal.   Last perceived fetal movement was within the past hour.      OB History    Gravida Para Term Preterm AB TAB SAB Ectopic Multiple Living   3 2 2       2      Past Medical History  Diagnosis Date  . Anxiety   . Left ureteral calculus   . Frequency of urination   . Urgency of urination   . Depression     hx pp depression   Past Surgical History  Procedure Laterality Date  . Inguinal hernia repair  AS CHILD  . Tympanostomy tube placement  566 MONTH OLD    REMOVAL AT AGE 67  . Tonsillectomy  2008  . Cystoscopy with retrograde pyelogram, ureteroscopy and stent placement Left 03/26/2012    Procedure: CYSTOSCOPY WITH RETROGRADE PYELOGRAM, URETEROSCOPY AND STENT PLACEMENT;  Surgeon: Milford Cageaniel Young Woodruff, MD;  [facility] [place];  Service: Urology;  Laterality: Left;  . Holmium laser application Left 03/26/2012    Procedure: HOLMIUM LASER APPLICATION;  Surgeon: Milford Cageaniel Young Woodruff, MD;  [facility] [place];  Service: Urology;  Laterality: Left;  . Stone extraction with basket Left 03/26/2012    Procedure: STONE EXTRACTION WITH BASKET;  Surgeon: Milford Cageaniel Young Woodruff, MD;  [facility] [place];  Service: Urology;  Laterality: Left;   Family History: family history includes Diabetes in her maternal grandmother; Heart disease in her maternal grandfather; Hypertension in her mother. Social History:  reports that she quit smoking about 6 years ago. Her smoking use included Cigarettes. She has a 2.5 pack-year smoking history. She has never used smokeless tobacco. She reports that she does not drink alcohol or use illicit drugs.   Prenatal  Transfer Tool  Maternal Diabetes: No Genetic Screening: Normal Maternal Ultrasounds/Referrals: Normal Fetal Ultrasounds or other Referrals:  None Maternal Substance Abuse:  No Significant Maternal Medications:  None Significant Maternal Lab Results:  None Other Comments:  None  ROS  Dilation: 10 Effacement (%): 100 Station: +2 Exam by:: Dr Marcelle OverlieHolland Blood pressure 120/87, pulse 94, temperature 97.9 F (36.6 C), temperature source Oral, resp. rate 18, height 5\' 3"  (1.6 m), weight 155 lb (70.308 kg), last menstrual period 03/08/2013, SpO2 99 %. Exam Physical Exam  Constitutional: She is oriented to person, place, and time. She appears well-developed and well-nourished.  HENT:  Head: Normocephalic and atraumatic.  Neck: Normal range of motion. Neck supple.  Cardiovascular: Normal rate and regular rhythm.   Respiratory: Effort normal and breath sounds normal.  GI:  Term FH, FHR 148  Genitourinary:  Now C/C/ + 2 with epid  Musculoskeletal: Normal range of motion.  Neurological: She is alert and oriented to person, place, and time.    Prenatal labs: ABO, Rh: --/--/O POS, O POS (11/03 0310) Antibody: NEG (11/03 0310) Rubella: Immune (03/24 0000) RPR: Nonreactive (03/24 0000)  HBsAg: Negative (03/24 0000)  HIV: Non-reactive (03/24 0000)  GBS: Positive (10/30 0000)   Assessment/Plan: Term IUP/labor. ABX for + GBS   Alexias Margerum M 12/17/2013, 8:10 AM

## 2013-12-17 NOTE — Progress Notes (Signed)
Delivery of live viable female by Dr Holland. APGARS 9,9 

## 2013-12-17 NOTE — Anesthesia Procedure Notes (Signed)
Epidural Patient location during procedure: OB Start time: 12/17/2013 4:28 AM End time: 12/17/2013 4:32 AM  Staffing Anesthesiologist: Leilani AbleHATCHETT, FRANKLIN  Preanesthetic Checklist Completed: patient identified, surgical consent, pre-op evaluation, timeout performed, IV checked, risks and benefits discussed and monitors and equipment checked  Epidural Patient position: sitting Prep: site prepped and draped and DuraPrep Patient monitoring: continuous pulse ox and blood pressure Approach: midline Location: L3-L4 Injection technique: LOR air  Needle:  Needle type: Tuohy  Needle gauge: 17 G Needle length: 9 cm and 9 Needle insertion depth: 5 cm cm Catheter type: closed end flexible Catheter size: 19 Gauge Catheter at skin depth: 10 cm Test dose: negative and Other  Assessment Sensory level: T9 Events: blood not aspirated, injection not painful, no injection resistance, negative IV test and no paresthesia  Additional Notes Reason for block:procedure for pain

## 2013-12-17 NOTE — Lactation Note (Signed)
This note was copied from the chart of Christina [name]. Lactation Consultation Note Initial visit at 10 hours of age.  Mom initially declined LC services do to a previous bad experience at a different hospital.  Mom is having difficulty getting baby to latch and agrees with MBU RN to have LC assist.  Mom previously used a nipple shield with oldest child for 13 months, but never needed one with her second.  Baby has had several mls by spoon, due to mom collecting colostrum with hand pump in effort to help evert nipples to encourage latch.  Baby STS in mom's arms asleep with face at breast.  Mom reports some feeding cues but baby not latching.  Mom is attempting cross cradle hold.  With assist baby did latch with several sucks that mom described at biting.  Allowed baby to suck on gloved finger and baby is biting with an unorganized weak suck. Baby is only 10 hours old and encouraged mom that baby may not be ready to eat yet to give her a lot of STS.  Instructed mom on finger suck training and mom was able to demonstrate and also note baby not aggressive with suck at this time.  Encouraged mom to wake baby for feeding attempts, suck training as needed and offer colostrum by spoon as needed.  Mom is anxious about her anatomy being adequate for baby, and is concerned that baby hasn't latched well like her 2nd child.   Right breast is more erect with compressible breast tissue, right nipple less erect and inverts minimally.  Mom previously attempted today with a NS, but baby didn't latch.  Encouraged mom to keep trying however she is comfortable.  [facility] resources given and discussed.  Encouraged to feed with early cues on demand.  Early newborn behavior discussed.  Hand expression demonstrated with colostrum visible, mom admits she is not good at hand expression, encouraged her to keep practicing.  Mom to call for assist as needed. [facility] to [facility] and suggested encouragement and reassurance to mom.  [facility]  [facility].     Patient Name: Christina Gerrit Hallsnna [place] ZOXWR'UToday's Date: 12/17/2013 Reason for consult: Initial assessment   Maternal Data Has patient been taught Hand Expression?: Yes Does the patient have breastfeeding experience prior to this delivery?: Yes  Feeding Feeding Type: Breast Fed  LATCH Score/Interventions Latch: Repeated attempts needed to sustain latch, nipple held in mouth throughout feeding, stimulation needed to elicit sucking reflex. Intervention(s): Adjust position;Assist with latch;Breast massage;Breast compression  Audible Swallowing: None  Type of Nipple: Everted at rest and after stimulation  Comfort (Breast/Nipple): Soft / non-tender     Hold (Positioning): Assistance needed to correctly position infant at breast and maintain latch. Intervention(s): Breastfeeding basics reviewed;Support Pillows;Position options;Skin to skin  LATCH Score: 6  Lactation Tools Discussed/Used Tools: Pump Breast pump type: Manual Initiated by:: RN Date initiated:: 12/17/13   Consult Status Consult Status: Follow-up Date: 12/18/13 Follow-up type: In-patient    Jannifer RodneyShoptaw, Orissa Arreaga Lynn 12/17/2013, 7:07 PM

## 2013-12-17 NOTE — Anesthesia Preprocedure Evaluation (Signed)
Anesthesia Evaluation  Patient identified by MRN, date of birth, ID band Patient awake    Reviewed: Allergy & Precautions, H&P , NPO status , Patient's Chart, lab work & pertinent test results  Airway Mallampati: I  TM Distance: >3 FB Neck ROM: full    Dental no notable dental hx.    Pulmonary former smoker,    Pulmonary exam normal       Cardiovascular negative cardio ROS      Neuro/Psych negative neurological ROS     GI/Hepatic negative GI ROS, Neg liver ROS,   Endo/Other  negative endocrine ROS  Renal/GU negative Renal ROS     Musculoskeletal   Abdominal Normal abdominal exam  (+)   Peds  Hematology negative hematology ROS (+)   Anesthesia Other Findings   Reproductive/Obstetrics (+) Pregnancy                             Anesthesia Physical Anesthesia Plan  ASA: II  Anesthesia Plan: Epidural   Post-op Pain Management:    Induction:   Airway Management Planned:   Additional Equipment:   Intra-op Plan:   Post-operative Plan:   Informed Consent: I have reviewed the patients History and Physical, chart, labs and discussed the procedure including the risks, benefits and alternatives for the proposed anesthesia with the patient or authorized representative who has indicated his/her understanding and acceptance.     Plan Discussed with:   Anesthesia Plan Comments:         Anesthesia Quick Evaluation  

## 2013-12-17 NOTE — Anesthesia Postprocedure Evaluation (Signed)
Anesthesia Post Note  Patient: Christina [name]  Procedure(s) Performed: * No procedures listed *  Anesthesia type: Epidural  Patient location: Mother/Baby  Post pain: Pain level controlled  Post assessment: Post-op Vital signs reviewed  Last Vitals:  Filed Vitals:   12/17/13 1250  BP: 110/60  Pulse: 68  Temp: 36.8 C  Resp: 16    Post vital signs: Reviewed  Level of consciousness:alert  Complications: No apparent anesthesia complications

## 2013-12-18 LAB — CBC
HCT: 33.1 % — ABNORMAL LOW (ref 36.0–46.0)
Hemoglobin: 11.1 g/dL — ABNORMAL LOW (ref 12.0–15.0)
MCH: 31.8 pg (ref 26.0–34.0)
MCHC: 33.5 g/dL (ref 30.0–36.0)
MCV: 94.8 fL (ref 78.0–100.0)
Platelets: 183 10*3/uL (ref 150–400)
RBC: 3.49 MIL/uL — ABNORMAL LOW (ref 3.87–5.11)
RDW: 14.7 % (ref 11.5–15.5)
WBC: 13 10*3/uL — AB (ref 4.0–10.5)

## 2013-12-18 NOTE — Progress Notes (Signed)
Post Partum Day 1 Subjective: no complaints, up ad lib, voiding, tolerating PO and + flatus  Objective: Blood pressure 99/63, pulse 80, temperature 97.5 F (36.4 C), temperature source Oral, resp. rate 18, height 5\' 3"  (1.6 m), weight 155 lb (70.308 kg), last menstrual period 03/08/2013, SpO2 97 %, unknown if currently breastfeeding.  Physical Exam:  General: alert and cooperative Lochia: appropriate Uterine Fundus: firm Incision: healing well DVT Evaluation: No evidence of DVT seen on physical exam. Negative Homan's sign. No cords or calf tenderness. No significant calf/ankle edema.   Recent Labs  12/17/13 0310 12/18/13 0540  HGB 12.3 11.1*  HCT 36.8 33.1*    Assessment/Plan: Plan for discharge tomorrow   LOS: 1 day   Christina [name] G 12/18/2013, 8:05 AM

## 2013-12-18 NOTE — Lactation Note (Addendum)
This note was copied from the chart of Christina Gerrit Hallsnna [place]. Lactation Consultation Note  P3, Ex BF.  Assisted mother in breastfeeding with and without NS. Without NS baby does latch but she does not sustain latch.  She sucks a few times and falls asleep. With the #20NS baby is more active.  Reminded parents to undress her to her diaper for each feeding, burp and change her diaper before feeding. Baby's latch is improving. Mother will start post pumping 4-6 times a day with her own DEBP and give volume in syringe in NS to keep her more active at the breast.  Provided mother with an extra NS. Suggested she should try bf without the NS everyday and use it as a training tool until her suck is stronger. Provided volume guidelines for post pumping and a cup for measurement purposes. Encouraged mother and suggest she call if she needs further assistance.  Patient Name: Christina [name] FAOZH'YToday's Date: 12/18/2013 Reason for consult: Follow-up assessment   Maternal Data    Feeding Feeding Type: Breast Fed Length of feed: 10 min  LATCH Score/Interventions                      Lactation Tools Discussed/Used Tools: Nipple Shields Nipple shield size: 20   Consult Status      Christina [name], Christina [name] 12/18/2013, 2:46 PM

## 2013-12-18 NOTE — Progress Notes (Signed)
CSW acknowledges consult for maternal history of anxiety and depression.   CSW attempted to meet with the MOB on two occassions. On first attempt, MOB was in the restroom.  On the second attempt, MOB was meeting with lactation specialist.   CSW will continue to attempt to meet with the MOB.

## 2013-12-18 NOTE — Progress Notes (Signed)
Clinical Social Work Department PSYCHOSOCIAL ASSESSMENT - MATERNAL/CHILD 12/18/2013  Patient:  Christina [name], Christina [name]  Account Number:  [id number]  Admit Date:  12/17/2013  Christina [name]:   Christina [name]   Clinical Social Worker:  Christina [name], CLINICAL SOCIAL WORKER   Date/Time:  12/17/2013 03:15 PM  Date Referred:  12/17/2013   Referral source  Central Nursery     Referred reason  Depression/Anxiety   Other referral source:    I:  FAMILY / [place] legal guardian:  PARENT  Guardian - [name] Guardian - Age Guardian - Address  Christina [name] [address] [place], [place] [address]  Christina [name]  same as above   Other household support members/support persons [name] Relationship DOB  Christina [name] Christina [name] 2009  Christina [name] 2011   Other support:   MOB stated that her parents live in [place].  She shared belief that she is well supported.  MOB endorsed strong/supportive relationship with the Christina [name].    II  PSYCHOSOCIAL DATA Information Source:  Family Interview  Financial and [employer] Employment:   MOB stated that she stays at home with her children.  The Christina [name] is a Pharmacist, hospital.   Financial resources:  Multimedia programmer If [place]:    School / Grade:  N/A Music therapist / Child Services Coordination / Early Interventions:   None reported.  Cultural issues impacting care:   None reported.    III  STRENGTHS Strengths  Adequate Resources  Home prepared for Child (including basic supplies)  Supportive family/friends   Strength comment:    IV  RISK FACTORS AND CURRENT PROBLEMS Current Problem:  YES   Risk Factor & Current Problem Patient Issue Family Issue Risk Factor / Current Problem Comment  Mental Illness Y N MOB presents with history of anxiety, depression, and postpartum depression.  MOB was on Zoloft prior to pregnancy.  MOB denied recent/current symptoms.    V  SOCIAL WORK ASSESSMENT CSW met with the MOB in her room in order to  complete the assessment. Consult was ordered due to MOB presenting with a history of anxiety, depression, and postpartum depression. MOB was receptive to the visit and was easily engaged.  She provided consent for assessment to be completed in the presence of the Christina [name] and her oldest Christina [name].  MOB displayed a full range in affect, presented in a pleasant mood, and expressed appreciation for the visit.   MOB declined presence of any psychosocial stressors that may negatively impact her transition into the postpartum period.  She expressed excitement as she prepares to transition to having three children.  MOB stated that she is currently overwhelmed due to difficulties with breastfeeding, but she reported intention to not become stressed if she is unable to successfully breast feed.  She stated that she and the Christina [name] have had numerous conversations to create a feeding plan, and stated that they plan to try for one week, and if it is unsuccessful, they will transition to formula feeding.  She shared belief that it may be what is best for her family.  MOB reflected upon previous postpartum period with her oldest Christina [name], where she stated that she was "very overwhelmed" due to breast feeding difficulties.  She stated that she does not want to "put my family through that", which is why she will transition to formula feeding if she experiences difficulties during this postpartum period.   MOB openly discussed her history of anxiety and depression.  She stated that she has historically  experienced symptoms of anxiety, dating back to prior to 2009.  She stated that she has been on/off Zoloft depending on her symptoms.  She stated that after her oldest Christina [name] was born, she re-started the Zoloft due to feeling overwhelmed with being a first time mother and having difficulties with breastfeeding.  She stated that she was on Zoloft for her second pregnancy, and denied any postpartum depression.  She stated that she had been  on Zoloft until January 2015. She reported that she has "felt great" during her pregnancy, and Christina [name] agreed.  MOB stated that she does not intend to immediately re-start her medications unless she notes symptoms of anxiety returning.  MOB expressed awareness that medications require sufficient time in bloodstream in order to receive therapeutic benefit.  She stated that she continues to have prescription from prior to pregnancy, and denied any barriers to re-starting medications if needed.  She reported, "I want to be the best mom possible. If I need Zoloft to do that, I will take it in order to be the mom I want to be". MOB stated that the Christina [name] has no difficulties discussing with her, the need to re-start her medications if he notes symptoms. MOB presents with insight on how untreated anxiety/depression may negatively impact her ability to parent.   No barriers to discharge.    VI SOCIAL WORK PLAN Social Work Therapist, art  No Further Intervention Required / No Barriers to Discharge   Type of pt/family education:   Postpartum depression   If child protective services report - county:   If child protective services report - date:   Information/referral to community resources comment:   MOB reported that she can return to her MD if she needs to re-start her medications.   Other social work plan:   CSW to follow up PRN.

## 2013-12-19 ENCOUNTER — Inpatient Hospital Stay (HOSPITAL_COMMUNITY): Admission: RE | Admit: 2013-12-19 | Payer: BC Managed Care – PPO | Source: Ambulatory Visit

## 2013-12-19 NOTE — Lactation Note (Signed)
This note was copied from the chart of Girl Gerrit Hallsnna [place]. Lactation Consultation Note  Patient Name: Girl Gerrit Hallsnna [place]z WUJWJ'XToday's Date: 12/19/2013   Mother and baby have discharge orders for home today.  Mother has experience with breastfeeding and reports her baby is feeding well, especially since 4 am this morning. Her milk is not fully in but seeing more colostrum. She denies any nipple pain or discomfort. Mother informed of post-discharge support and given phone number to the lactation department, including services for phone call assistance; out-patient appointments; and breastfeeding support group. List of other breastfeeding resources in the community given in the handout. Encouraged mother to call for problems or concerns related to breastfeeding. Baby has a pediatrician appointment tomorrow for a weight check. Breastfeeding is progressing well.  Maternal Data    Feeding Feeding Type: Breast Fed Length of feed: 30 min  LATCH Score/Interventions Latch: Grasps breast easily, tongue down, lips flanged, rhythmical sucking.  Audible Swallowing: A few with stimulation  Type of Nipple: Everted at rest and after stimulation  Comfort (Breast/Nipple): Soft / non-tender     Hold (Positioning): No assistance needed to correctly position infant at breast.  LATCH Score: 9  Lactation Tools Discussed/Used     Consult Status      Omar PersonDaly, Olyver Hawes M 12/19/2013, 10:53 AM

## 2013-12-19 NOTE — Lactation Note (Signed)
This note was copied from the chart of Christina Gerrit Hallsnna [place]. Lactation Consultation Note  Patient Name: Christina [name] ZOXWR'UToday's Date: 12/19/2013  Mother is not using a nipple shield with feedings. Baby is latching well.   Maternal Data    Feeding Feeding Type: Breast Fed Length of feed: 30 min  LATCH Score/Interventions Latch: Grasps breast easily, tongue down, lips flanged, rhythmical sucking.  Audible Swallowing: A few with stimulation  Type of Nipple: Everted at rest and after stimulation  Comfort (Breast/Nipple): Soft / non-tender     Hold (Positioning): No assistance needed to correctly position infant at breast.  LATCH Score: 9  Lactation Tools Discussed/Used     Consult Status      Omar PersonDaly, Edelmira Gallogly M 12/19/2013, 11:02 AM

## 2013-12-19 NOTE — Progress Notes (Signed)
Post Partum Day 2 Subjective: no complaints, up ad lib, voiding, tolerating PO and + flatus  Objective: Blood pressure 90/54, pulse 68, temperature 98 F (36.7 C), temperature source Oral, resp. rate 18, height 5\' 3"  (1.6 m), weight 155 lb (70.308 kg), last menstrual period 03/08/2013, SpO2 97 %, unknown if currently breastfeeding.  Physical Exam:  General: alert and cooperative Lochia: appropriate Uterine Fundus: firm Incision: healing well DVT Evaluation: No evidence of DVT seen on physical exam. Negative Homan's sign. No cords or calf tenderness. No significant calf/ankle edema.   Recent Labs  12/17/13 0310 12/18/13 0540  HGB 12.3 11.1*  HCT 36.8 33.1*    Assessment/Plan: Discharge home   Unable to complete discharge orders secondary to admission orders not complete   LOS: 2 days   Jonta Gastineau G 12/19/2013, 8:00 AM

## 2013-12-19 NOTE — Discharge Summary (Signed)
Obstetric Discharge Summary Reason for Admission: onset of labor and rupture of membranes Prenatal Procedures: ultrasound Intrapartum Procedures: spontaneous vaginal delivery Postpartum Procedures: none Complications-Operative and Postpartum: 2 degree perineal laceration HEMOGLOBIN  Date Value Ref Range Status  12/18/2013 11.1* 12.0 - 15.0 g/dL Final   HCT  Date Value Ref Range Status  12/18/2013 33.1* 36.0 - 46.0 % Final    Physical Exam:  General: alert and cooperative Lochia: appropriate Uterine Fundus: firm Incision: healing well DVT Evaluation: No evidence of DVT seen on physical exam. Negative Homan's sign. No cords or calf tenderness. No significant calf/ankle edema.  Discharge Diagnoses: Term Pregnancy-delivered  Discharge Information: Date: 12/19/2013 Activity: pelvic rest Diet: routine Medications: PNV and Ibuprofen Condition: stable Instructions: refer to practice specific booklet Discharge to: home   Newborn Data: Live born female  Birth Weight: 9 lb 9 oz (4338 g) APGAR: 9, 9  Home with mother.  Christina [name] G 12/19/2013, 7:59 AM

## 2014-02-12 IMAGING — CT CT ABD-PELV W/O CM
2 of 4 series · 13 of 32 positions shown, 18 images · non-contrast
Comparison: None.

CLINICAL DATA: Left flank pain.  Evaluate for stone.

CT ABDOMEN AND PELVIS WITHOUT CONTRAST
TECHNIQUE: Multidetector CT imaging of the abdomen and pelvis was
performed following the standard protocol without intravenous
contrast.

[Series 2: renal stone · axial · 0.72mm/px · z∈[-410,-110]mm · 5 of 92 slices shown, 10 images]
[im 16/92  soft-tissue]
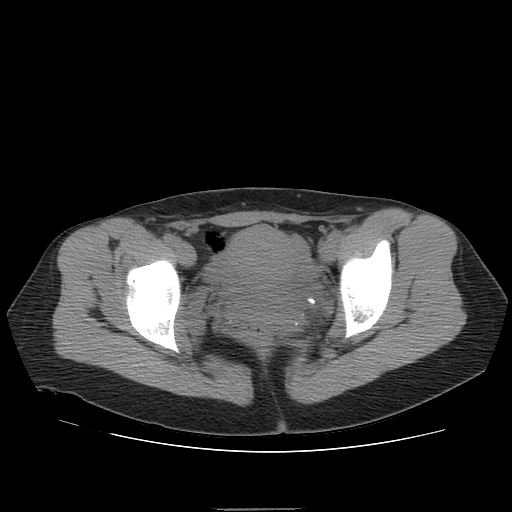
[im 16/92  bone]
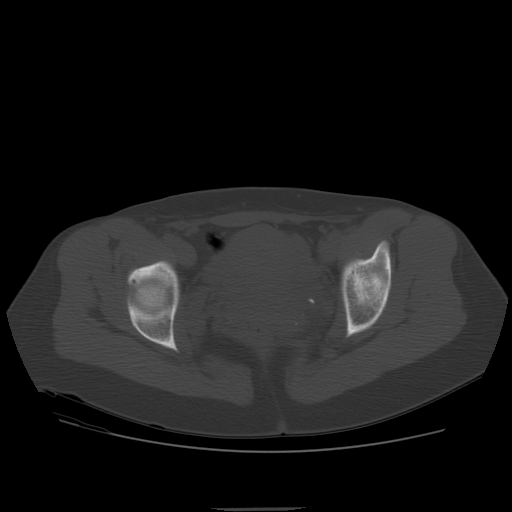
[im 31/92  soft-tissue]
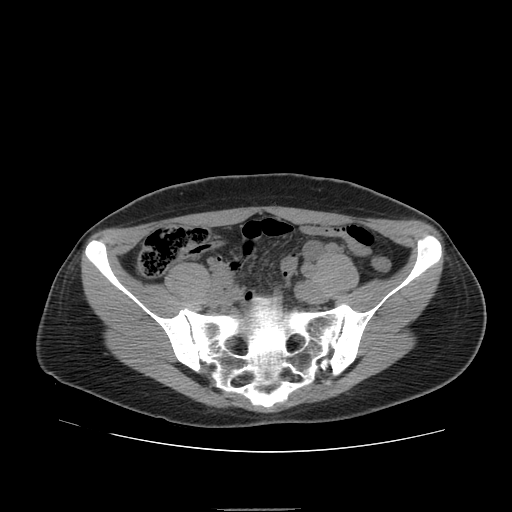
[im 31/92  lung]
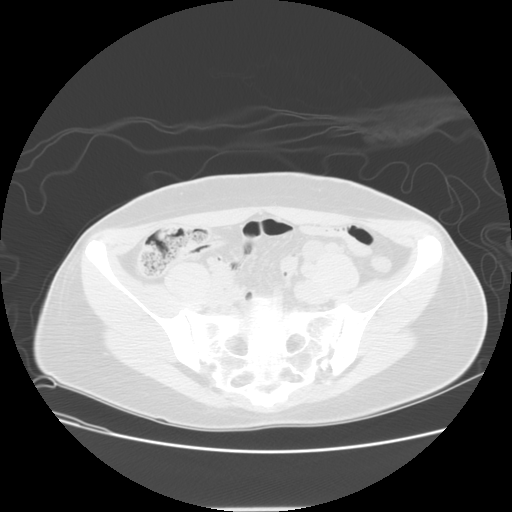
[im 46/92  soft-tissue]
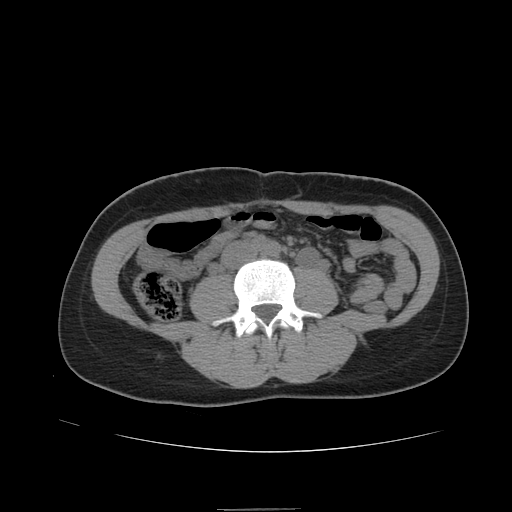
[im 46/92  lung]
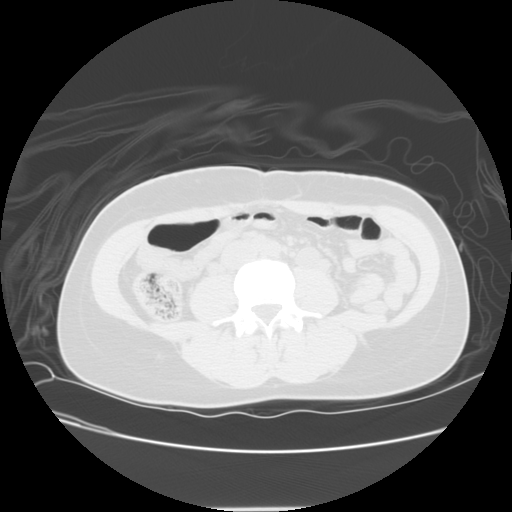
[im 61/92  soft-tissue]
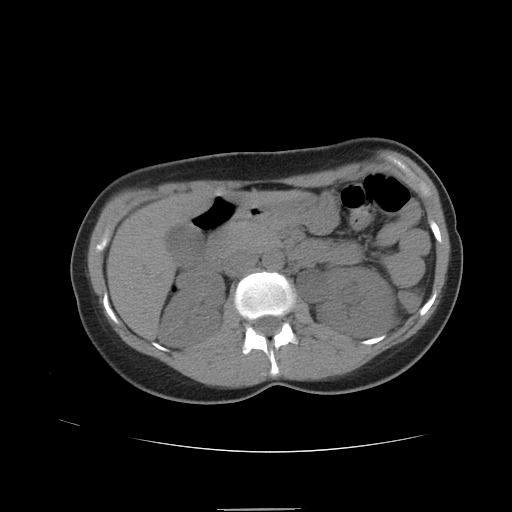
[im 61/92  lung]
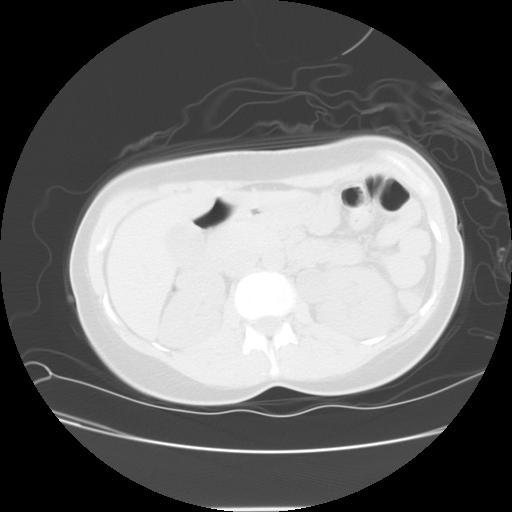
[im 76/92  soft-tissue]
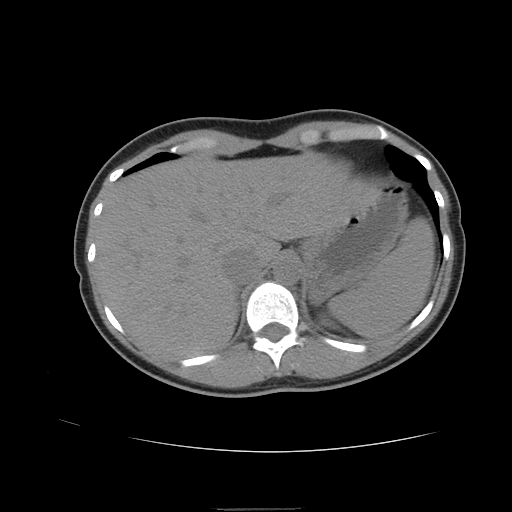
[im 76/92  lung]
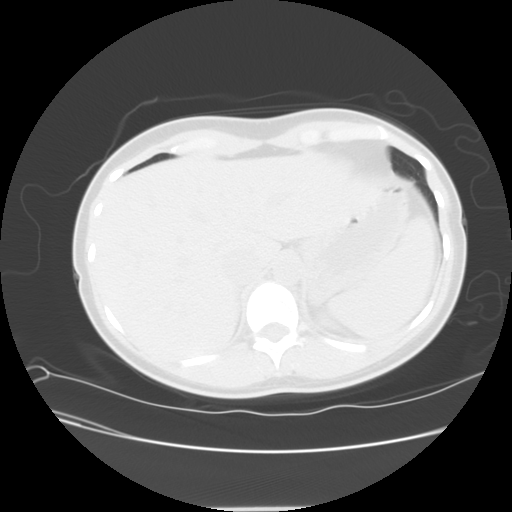

[Series 401: sag · sagittal · 0.93mm/px · 8 of 148 slices shown]
[im 13/148  soft-tissue]
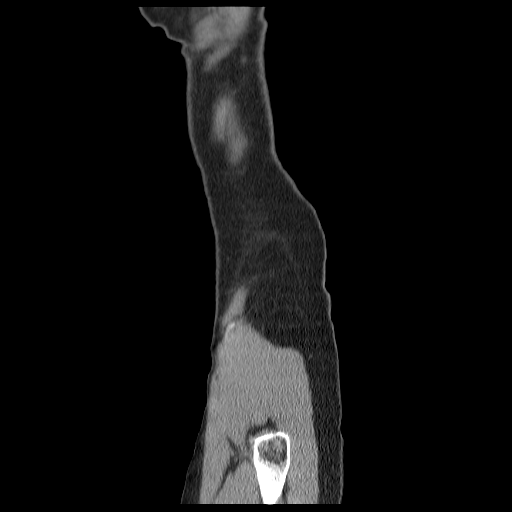
[im 37/148  soft-tissue]
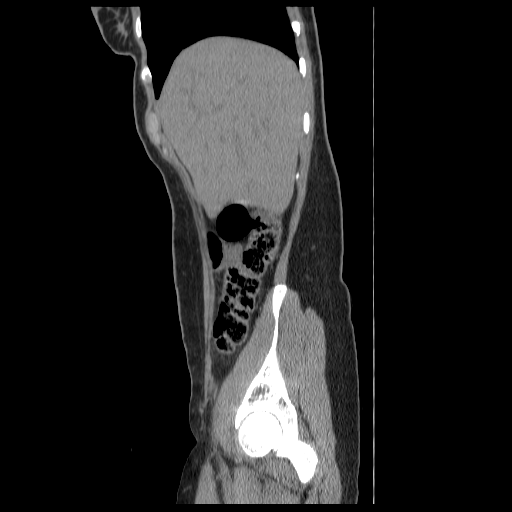
[im 50/148  soft-tissue]
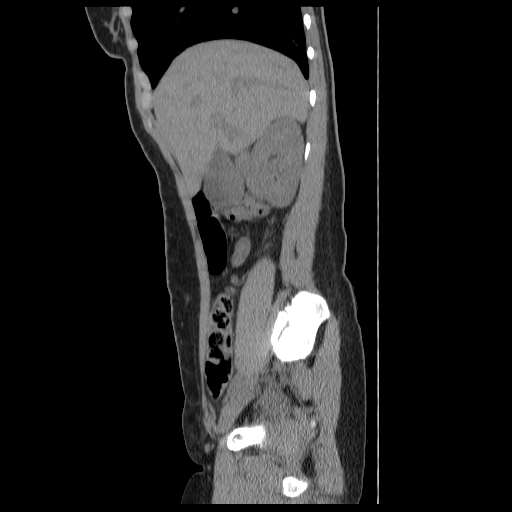
[im 62/148  soft-tissue]
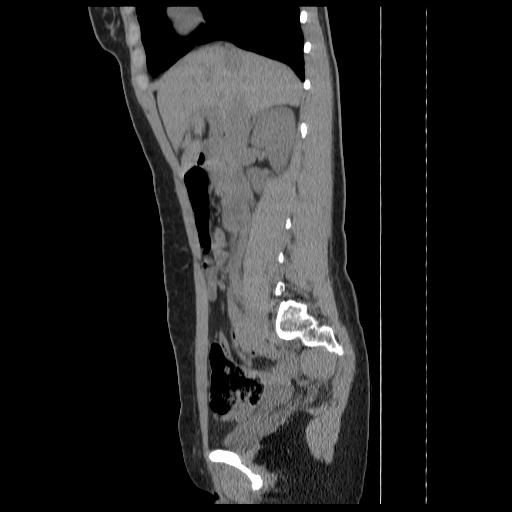
[im 86/148  soft-tissue]
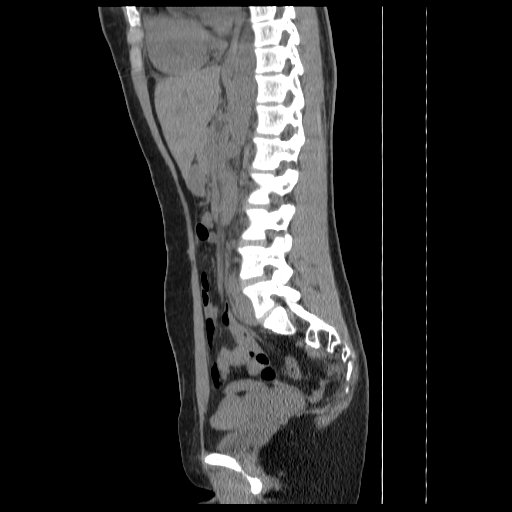
[im 99/148  soft-tissue]
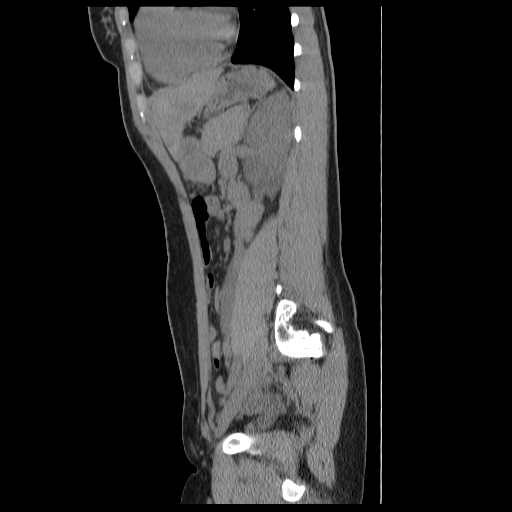
[im 111/148  soft-tissue]
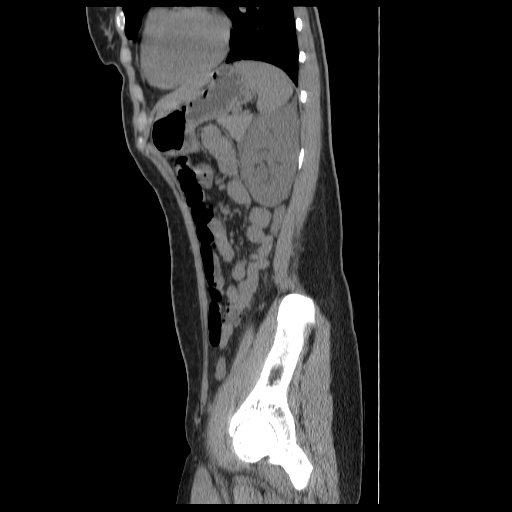
[im 135/148  soft-tissue]
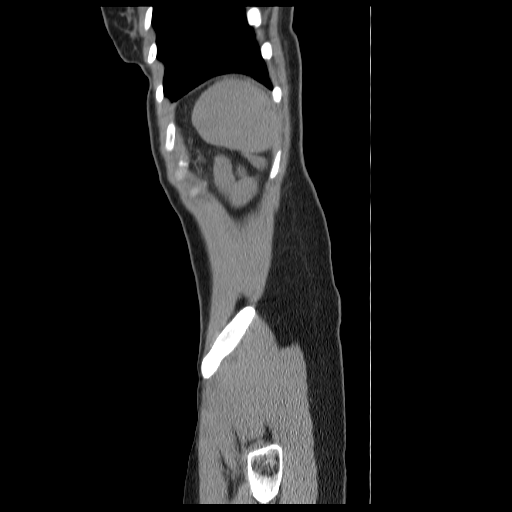

[13 of 32 positions shown; findings below may reference images not displayed]

FINDINGS: Clear lung bases.  Unenhanced appearance of the liver,
spleen, pancreas, gallbladder, and adrenal glands is normal.
Normal appearing right kidney.  Marked left hydronephrosis and
hydroureter secondary to a partially obstructing 3 x 5 mm distal
ureteral calculus (image 77).  Other calcific densities likely
represent phleboliths although a second even more distal ureteral
stone (image 80) cannot completely be excluded.  No bladder
calculi.  Unremarkable osseous structures.  Normal appendix.  No
stomach, small bowel, or colon abnormality.
IMPRESSION: Marked left hydronephrosis and hydroureter secondary to a partially
obstructing 3 x 5 mm distal ureteral calculus.  See comments above.

## 2017-04-30 ENCOUNTER — Other Ambulatory Visit: Payer: Self-pay

## 2017-04-30 ENCOUNTER — Emergency Department (HOSPITAL_BASED_OUTPATIENT_CLINIC_OR_DEPARTMENT_OTHER)
Admission: EM | Admit: 2017-04-30 | Discharge: 2017-04-30 | Disposition: A | Discharge status: Home / Self Care | Payer: BC Managed Care – PPO | Attending: Emergency Medicine | Admitting: Emergency Medicine

## 2017-04-30 ENCOUNTER — Encounter (HOSPITAL_BASED_OUTPATIENT_CLINIC_OR_DEPARTMENT_OTHER): Payer: Self-pay | Admitting: *Deleted

## 2017-04-30 ENCOUNTER — Emergency Department (HOSPITAL_BASED_OUTPATIENT_CLINIC_OR_DEPARTMENT_OTHER): Payer: BC Managed Care – PPO

## 2017-04-30 DIAGNOSIS — N200 Calculus of kidney: Secondary | ICD-10-CM | POA: Diagnosis not present

## 2017-04-30 DIAGNOSIS — F329 Major depressive disorder, single episode, unspecified: Secondary | ICD-10-CM | POA: Diagnosis not present

## 2017-04-30 DIAGNOSIS — F419 Anxiety disorder, unspecified: Secondary | ICD-10-CM | POA: Diagnosis not present

## 2017-04-30 DIAGNOSIS — Z87891 Personal history of nicotine dependence: Secondary | ICD-10-CM | POA: Insufficient documentation

## 2017-04-30 DIAGNOSIS — R109 Unspecified abdominal pain: Secondary | ICD-10-CM

## 2017-04-30 DIAGNOSIS — R1031 Right lower quadrant pain: Secondary | ICD-10-CM | POA: Diagnosis present

## 2017-04-30 LAB — URINALYSIS, ROUTINE W REFLEX MICROSCOPIC
BILIRUBIN URINE: NEGATIVE
Glucose, UA: NEGATIVE mg/dL
KETONES UR: NEGATIVE mg/dL
LEUKOCYTES UA: NEGATIVE
NITRITE: NEGATIVE
PH: 6.5 (ref 5.0–8.0)
PROTEIN: NEGATIVE mg/dL
Specific Gravity, Urine: 1.025 (ref 1.005–1.030)

## 2017-04-30 LAB — COMPREHENSIVE METABOLIC PANEL
ALBUMIN: 4.3 g/dL (ref 3.5–5.0)
ALK PHOS: 69 U/L (ref 38–126)
ALT: 20 U/L (ref 14–54)
AST: 22 U/L (ref 15–41)
Anion gap: 10 (ref 5–15)
BUN: 11 mg/dL (ref 6–20)
CALCIUM: 8.8 mg/dL — AB (ref 8.9–10.3)
CHLORIDE: 106 mmol/L (ref 101–111)
CO2: 23 mmol/L (ref 22–32)
CREATININE: 0.97 mg/dL (ref 0.44–1.00)
GFR calc non Af Amer: >60 mL/min (ref >60)
Glucose, Bld: 125 mg/dL — ABNORMAL HIGH (ref 65–99)
Potassium: 3.8 mmol/L (ref 3.5–5.1)
SODIUM: 139 mmol/L (ref 135–145)
Total Bilirubin: 0.9 mg/dL (ref 0.3–1.2)
Total Protein: 7.5 g/dL (ref 6.5–8.1)

## 2017-04-30 LAB — URINALYSIS, MICROSCOPIC (REFLEX): WBC UA: NONE SEEN WBC/hpf (ref 0–5)

## 2017-04-30 LAB — CBC WITH DIFFERENTIAL/PLATELET
BASOS PCT: 0 %
Basophils Absolute: 0 10*3/uL (ref 0.0–0.1)
EOS ABS: 0 10*3/uL (ref 0.0–0.7)
EOS PCT: 0 %
HCT: 39.3 % (ref 36.0–46.0)
HEMOGLOBIN: 13 g/dL (ref 12.0–15.0)
LYMPHS ABS: 1.3 10*3/uL (ref 0.7–4.0)
Lymphocytes Relative: 13 %
MCH: 29.7 pg (ref 26.0–34.0)
MCHC: 33.1 g/dL (ref 30.0–36.0)
MCV: 89.7 fL (ref 78.0–100.0)
Monocytes Absolute: 0.4 10*3/uL (ref 0.1–1.0)
Monocytes Relative: 4 %
NEUTROS PCT: 83 %
Neutro Abs: 8.3 10*3/uL — ABNORMAL HIGH (ref 1.7–7.7)
Platelets: 204 10*3/uL (ref 150–400)
RBC: 4.38 MIL/uL (ref 3.87–5.11)
RDW: 13.4 % (ref 11.5–15.5)
WBC: 10 10*3/uL (ref 4.0–10.5)

## 2017-04-30 LAB — PREGNANCY, URINE: PREG TEST UR: NEGATIVE

## 2017-04-30 MED ORDER — ONDANSETRON HCL 4 MG/2ML IJ SOLN
4.0000 mg | Freq: Once | INTRAMUSCULAR | Status: AC
  Administered 2017-04-30: 4 mg via INTRAVENOUS
  Filled 2017-04-30: qty 2

## 2017-04-30 MED ORDER — OXYCODONE-ACETAMINOPHEN 5-325 MG PO TABS: 1.0000 | ORAL_TABLET | Freq: Four times a day (QID) | ORAL | 0 refills | Status: AC | PRN

## 2017-04-30 MED ORDER — SODIUM CHLORIDE 0.9 % IV BOLUS (SEPSIS)
1000.0000 mL | Freq: Once | INTRAVENOUS | Status: AC
  Administered 2017-04-30: 1000 mL via INTRAVENOUS

## 2017-04-30 MED ORDER — MORPHINE SULFATE (PF) 4 MG/ML IV SOLN
4.0000 mg | Freq: Once | INTRAVENOUS | Status: AC
  Administered 2017-04-30: 4 mg via INTRAVENOUS
  Filled 2017-04-30: qty 1

## 2017-04-30 MED ORDER — KETOROLAC TROMETHAMINE 30 MG/ML IJ SOLN
15.0000 mg | Freq: Once | INTRAMUSCULAR | Status: AC
  Administered 2017-04-30: 15 mg via INTRAVENOUS
  Filled 2017-04-30: qty 1

## 2017-04-30 MED ORDER — ONDANSETRON 4 MG PO TBDP: 4.0000 mg | ORAL_TABLET | Freq: Three times a day (TID) | ORAL | 0 refills | Status: AC | PRN

## 2017-04-30 MED ORDER — OXYCODONE-ACETAMINOPHEN 5-325 MG PO TABS: 1.0000 | ORAL_TABLET | Freq: Once | ORAL | Status: DC

## 2017-04-30 MED ORDER — FENTANYL CITRATE (PF) 100 MCG/2ML IJ SOLN
50.0000 ug | INTRAMUSCULAR | Status: AC | PRN
  Administered 2017-04-30 (×2): 50 ug via INTRAVENOUS
  Filled 2017-04-30 (×2): qty 2

## 2017-04-30 MED ORDER — POLYETHYLENE GLYCOL 3350 17 G PO PACK: 17.0000 g | PACK | Freq: Every day | ORAL | 0 refills | Status: AC

## 2017-04-30 NOTE — ED Notes (Signed)
Patient is resting comfortably, husband at bedside. Waiting for ED provider

## 2017-04-30 NOTE — ED Notes (Signed)
Pt tolerated Morphine well. RN at bedside, family at bedside.  Pt reports heaviness in her chest that passed within 30 seconds of morphine being given. No acute distress noted.

## 2017-04-30 NOTE — Discharge Instructions (Signed)
As discussed, make sure that you drink plenty of fluids maintain your urine clear.  Pain medications as needed.  Zofran as needed for nausea vomiting.  Follow-up with urology. If symptoms worsen, worsening pain, fever, chills, back pain, difficulty urinating or any other new concerning symptoms in the meantime.

## 2017-04-30 NOTE — ED Notes (Signed)
Pt sleeping.  No acute distress noted. Pt denies pain at this time.

## 2017-04-30 NOTE — ED Provider Notes (Signed)
[facility] EMERGENCY DEPARTMENT Provider Note   CSN: [id number] Arrival date & time: 04/30/17  1230     History   Chief Complaint Chief Complaint  Patient presents with  . Nephrolithiasis    HPI Christina [name] is a 37 y.o. female with past medical history significant for nephrolithiasis, anxiety depression, presenting with sudden onset right flank pain radiating down her groin.  She reports that this pain is very similar to her prior kidney stone she has had.  Denies any fever, chills, has had nausea but no vomiting.  She denies dysuria, decreased urine, she does report frequency but feels like she is fully emptying her bladder.  No gross hematuria.  HPI  Past Medical History:  Diagnosis Date  . Anxiety   . Depression    hx pp depression  . Frequency of urination   . Left ureteral calculus   . Urgency of urination     Patient Active Problem List   Diagnosis Date Noted  . Active labor 12/17/2013    Past Surgical History:  Procedure Laterality Date  . CYSTOSCOPY WITH RETROGRADE PYELOGRAM, URETEROSCOPY AND STENT PLACEMENT Left 03/26/2012   Procedure: CYSTOSCOPY WITH RETROGRADE PYELOGRAM, URETEROSCOPY AND STENT PLACEMENT;  Surgeon: Milford Cage, MD;  Location: [facility];  Service: Urology;  Laterality: Left;  . HOLMIUM LASER APPLICATION Left 03/26/2012   Procedure: HOLMIUM LASER APPLICATION;  Surgeon: Milford Cage, MD;  Location: [facility];  Service: Urology;  Laterality: Left;  . INGUINAL HERNIA REPAIR  AS CHILD  . STONE EXTRACTION WITH BASKET Left 03/26/2012   Procedure: STONE EXTRACTION WITH BASKET;  Surgeon: Milford Cage, MD;  Location: [facility];  Service: Urology;  Laterality: Left;  . TONSILLECTOMY  2008  . TYMPANOSTOMY TUBE PLACEMENT  14 MONTH OLD   REMOVAL AT AGE 37    OB History    Gravida Para Term Preterm AB Living   3 3 3     3    SAB TAB Ectopic Multiple Live Births   0 3       Home Medications    Prior to Admission medications   Medication Sig Start Date End Date Taking? Authorizing Provider  ondansetron (ZOFRAN ODT) 4 MG disintegrating tablet Take 1 tablet (4 mg total) by mouth every 8 (eight) hours as needed for nausea or vomiting. 04/30/17   Georgiana Shore, PA-C  oxyCODONE-acetaminophen (PERCOCET/ROXICET) 5-325 MG tablet Take 1 tablet by mouth every 6 (six) hours as needed for severe pain. 04/30/17   Mathews Robinsons B, PA-C  polyethylene glycol [facility]) packet Take 17 g by mouth daily. 04/30/17   Georgiana Shore, PA-C    Family History Family History  Problem Relation Age of Onset  . Hypertension Mother   . Diabetes Maternal Grandmother   . Heart disease Maternal Grandfather     Social History Social History   Tobacco Use  . Smoking status: Former Smoker    Packs/day: 0.25    Years: 10.00    Pack years: 2.50    Types: Cigarettes    Last attempt to quit: 03/24/2007    Years since quitting: 10.1  . Smokeless tobacco: Never Used  Substance Use Topics  . Alcohol use: No  . Drug use: No     Allergies   Codeine; Dimetapp c [phenylephrine-bromphen-codeine]; and Erythromycin   Review of Systems Review of Systems  Constitutional: Negative for chills, fatigue and fever.  HENT: Negative for ear discharge and ear  pain.   Respiratory: Negative for cough, shortness of breath, wheezing and stridor.   Cardiovascular: Negative for chest pain and palpitations.  Gastrointestinal: Positive for abdominal pain and nausea. Negative for abdominal distention, constipation, diarrhea and vomiting.  Genitourinary: Positive for flank pain and frequency. Negative for decreased urine volume, difficulty urinating, dysuria, hematuria, urgency, vaginal bleeding, vaginal discharge and vaginal pain.  Musculoskeletal: Negative for arthralgias, back pain, myalgias, neck pain and neck stiffness.  Skin: Negative for color change, pallor and rash.    Neurological: Negative for dizziness, seizures, syncope, weakness, light-headedness and headaches.     Physical Exam Updated Vital Signs BP 109/69 (BP Location: Left Arm)   Pulse 60   Temp 98.3 F (36.8 C) (Oral)   Resp 18   Ht 5\' 3"  (1.6 m)   Wt 65.8 kg (145 lb)   LMP  (LMP Unknown)   SpO2 100%   BMI 25.69 kg/m   Physical Exam  Constitutional: She appears well-developed and well-nourished. No distress.  Afebrile, nontoxic-appearing, sleeping comfortably in bed no acute distress after 1 dose of fentanyl.  HENT:  Head: Normocephalic and atraumatic.  Eyes: Conjunctivae and EOM are normal. Right eye exhibits no discharge. Left eye exhibits no discharge. No scleral icterus.  Neck: Normal range of motion. Neck supple.  Cardiovascular: Normal rate, regular rhythm and normal heart sounds.  No murmur heard. Pulmonary/Chest: Effort normal and breath sounds normal. No stridor. No respiratory distress. She has no wheezes. She has no rales.  Abdominal: Soft. Bowel sounds are normal. She exhibits no distension and no mass. There is no tenderness. There is no rebound and no guarding.  Abdomen soft and nontender to palpation.  No CVA tenderness.  Pain is not reproducible with palpation.  Musculoskeletal: Normal range of motion. She exhibits no edema.  Neurological: She is alert. No sensory deficit. She exhibits normal muscle tone.  Skin: Skin is warm and dry. No rash noted. She is not diaphoretic. No erythema. No pallor.  Psychiatric: She has a normal mood and affect.  Nursing note and vitals reviewed.    ED Treatments / Results  Labs (all labs ordered are listed, but only abnormal results are displayed) Labs Reviewed  URINALYSIS, ROUTINE W REFLEX MICROSCOPIC - Abnormal; Notable for the following components:      Result Value   Hgb urine dipstick MODERATE (*)    All other components within normal limits  CBC WITH DIFFERENTIAL/PLATELET - Abnormal; Notable for the following components:    Neutro Abs 8.3 (*)    All other components within normal limits  COMPREHENSIVE METABOLIC PANEL - Abnormal; Notable for the following components:   Glucose, Bld 125 (*)    Calcium 8.8 (*)    All other components within normal limits  URINALYSIS, MICROSCOPIC (REFLEX) - Abnormal; Notable for the following components:   Bacteria, UA MANY (*)    Squamous Epithelial / LPF 0-5 (*)    All other components within normal limits  URINE CULTURE  PREGNANCY, URINE    EKG  EKG Interpretation None       Radiology [facility]  Result Date: 04/30/2017 CLINICAL DATA:  Right-sided abdominal pain. Nausea. History of left-sided stone. EXAM: RENAL / URINARY TRACT ULTRASOUND COMPLETE COMPARISON:  CT scan March 13, 2012 FINDINGS: Right Kidney: Length: 10.3 cm. There is a 6 mm stone in the right kidney. Moderate hydronephrosis. Left Kidney: Length: 10 cm. Echogenicity within normal limits. No mass or hydronephrosis visualized. Bladder: The bladder is decompressed limiting evaluation. IMPRESSION: 1. Right  moderate hydronephrosis. This finding was not seen on a CT scan from 2014 but is otherwise age indeterminate. An acute ureteral stone could result in this appearance in the setting of acute pain. 2. 6 mm stone in the right kidney. 3. The left kidney is normal.  The bladder is decompressed. Electronically Signed   By: Gerome Sam III M.D   On: 04/30/2017 16:43    Procedures Procedures (including critical care time)  Medications Ordered in ED Medications  fentaNYL (SUBLIMAZE) injection 50 mcg (50 mcg Intravenous Given 04/30/17 1442)  ondansetron (ZOFRAN) injection 4 mg (4 mg Intravenous Given 04/30/17 1312)  sodium chloride 0.9 % bolus 1,000 mL (0 mLs Intravenous Stopped 04/30/17 1449)  ketorolac (TORADOL) 30 MG/ML injection 15 mg (15 mg Intravenous Given 04/30/17 1417)  ondansetron (ZOFRAN) injection 4 mg (4 mg Intravenous Given 04/30/17 1654)  morphine 4 MG/ML injection 4 mg (4 mg Intravenous Given  04/30/17 1654)     Initial Impression / Assessment and Plan / ED Course  I have reviewed the triage vital signs and the nursing notes.  Pertinent labs & imaging results that were available during my care of the patient were reviewed by me and considered in my medical decision making (see chart for details).    Patient presenting with right flank pain which she reports is the same as her prior episode of nephrolithiasis.  No CVA tenderness, pain is not reproducible on palpation.  Patient is afebrile nontoxic.  Her pain was managed while in the emergency department. She was given IV fluids, Zofran and Toradol.  Ultrasound renal with moderate hydronephrosis UA with hematuria no evidence of UTI.  Bacteria seen, ordered culture. Labs otherwise unremarkable  Patient initially improved and was lying comfortably on reassessment, but later started experiencing more pain. She was provided with further analgesia, will reassess.  On reassessment, patient had significantly improved and was pain-free.  Will discharge home with symptomatic relief and close follow-up with urology  Discussed strict return precautions and advised to return to the emergency department if experiencing any new or worsening symptoms. Instructions were understood and patient agreed with discharge plan.  Final Clinical Impressions(s) / ED Diagnoses   Final diagnoses:  Nephrolithiasis  Right flank pain    ED Discharge Orders        Ordered    oxyCODONE-acetaminophen (PERCOCET/ROXICET) 5-325 MG tablet  Every 6 hours PRN     04/30/17 1754    ondansetron (ZOFRAN ODT) 4 MG disintegrating tablet  Every 8 hours PRN     04/30/17 1755    polyethylene glycol (MIRALAX) packet  Daily     04/30/17 1806       Gregary Cromer 04/30/17 1852    Phillis Haggis, MD 05/04/17 1212

## 2017-04-30 NOTE — ED Triage Notes (Signed)
Right flank pain started yesterday. Now lower pelvic pain. Hx of kidney stones-states that it feels the same. Pt in obvious pain in triage.

## 2017-05-01 ENCOUNTER — Encounter (HOSPITAL_COMMUNITY)
Admission: AD | Disposition: A | Discharge status: Home / Self Care | Payer: Self-pay | Source: Ambulatory Visit | Attending: Urology

## 2017-05-01 ENCOUNTER — Other Ambulatory Visit: Payer: Self-pay | Admitting: Urology

## 2017-05-01 ENCOUNTER — Ambulatory Visit (HOSPITAL_COMMUNITY): Payer: BC Managed Care – PPO

## 2017-05-01 ENCOUNTER — Encounter (HOSPITAL_COMMUNITY): Payer: Self-pay | Admitting: *Deleted

## 2017-05-01 ENCOUNTER — Ambulatory Visit (HOSPITAL_COMMUNITY)
Admission: AD | Admit: 2017-05-01 | Discharge: 2017-05-01 | Disposition: A | Discharge status: Home / Self Care | Payer: BC Managed Care – PPO | Source: Ambulatory Visit | Attending: Urology | Admitting: Urology

## 2017-05-01 ENCOUNTER — Ambulatory Visit (HOSPITAL_COMMUNITY): Payer: BC Managed Care – PPO | Admitting: Anesthesiology

## 2017-05-01 DIAGNOSIS — N131 Hydronephrosis with ureteral stricture, not elsewhere classified: Secondary | ICD-10-CM | POA: Insufficient documentation

## 2017-05-01 DIAGNOSIS — Z881 Allergy status to other antibiotic agents status: Secondary | ICD-10-CM | POA: Diagnosis not present

## 2017-05-01 DIAGNOSIS — Z87891 Personal history of nicotine dependence: Secondary | ICD-10-CM | POA: Insufficient documentation

## 2017-05-01 DIAGNOSIS — Z79899 Other long term (current) drug therapy: Secondary | ICD-10-CM | POA: Diagnosis not present

## 2017-05-01 DIAGNOSIS — F419 Anxiety disorder, unspecified: Secondary | ICD-10-CM | POA: Diagnosis not present

## 2017-05-01 DIAGNOSIS — N132 Hydronephrosis with renal and ureteral calculous obstruction: Secondary | ICD-10-CM | POA: Insufficient documentation

## 2017-05-01 DIAGNOSIS — Z888 Allergy status to other drugs, medicaments and biological substances status: Secondary | ICD-10-CM | POA: Diagnosis not present

## 2017-05-01 DIAGNOSIS — Z885 Allergy status to narcotic agent status: Secondary | ICD-10-CM | POA: Diagnosis not present

## 2017-05-01 HISTORY — PX: CYSTOSCOPY WITH RETROGRADE PYELOGRAM, URETEROSCOPY AND STENT PLACEMENT: SHX5789

## 2017-05-01 SURGERY — CYSTOURETEROSCOPY, WITH RETROGRADE PYELOGRAM AND STENT INSERTION
Anesthesia: General | Site: Ureter | Laterality: Right

## 2017-05-01 MED ORDER — PHENAZOPYRIDINE HCL 200 MG PO TABS: 200.0000 mg | ORAL_TABLET | Freq: Three times a day (TID) | ORAL | 0 refills | Status: AC | PRN

## 2017-05-01 MED ORDER — ONDANSETRON HCL 4 MG/2ML IJ SOLN
INTRAMUSCULAR | Status: DC | PRN
  Administered 2017-05-01 (×2): 4 mg via INTRAVENOUS

## 2017-05-01 MED ORDER — FENTANYL CITRATE (PF) 100 MCG/2ML IJ SOLN
INTRAMUSCULAR | Status: DC | PRN
  Administered 2017-05-01 (×2): 50 ug via INTRAVENOUS

## 2017-05-01 MED ORDER — MIDAZOLAM HCL 2 MG/2ML IJ SOLN
1.0000 mg | Freq: Once | INTRAMUSCULAR | Status: AC
  Administered 2017-05-01: 1 mg via INTRAVENOUS
  Filled 2017-05-01: qty 2

## 2017-05-01 MED ORDER — ACETAMINOPHEN 650 MG RE SUPP: 650.0000 mg | RECTAL | Status: DC | PRN

## 2017-05-01 MED ORDER — KETOROLAC TROMETHAMINE 10 MG PO TABS: 10.0000 mg | ORAL_TABLET | Freq: Four times a day (QID) | ORAL | 0 refills | Status: AC | PRN

## 2017-05-01 MED ORDER — FENTANYL CITRATE (PF) 100 MCG/2ML IJ SOLN
50.0000 ug | Freq: Once | INTRAMUSCULAR | Status: AC
  Administered 2017-05-01: 50 ug via INTRAVENOUS
  Filled 2017-05-01: qty 2

## 2017-05-01 MED ORDER — PROPOFOL 10 MG/ML IV BOLUS
INTRAVENOUS | Status: DC | PRN
  Administered 2017-05-01: 150 mg via INTRAVENOUS
  Administered 2017-05-01: 50 mg via INTRAVENOUS

## 2017-05-01 MED ORDER — EPHEDRINE SULFATE-NACL 50-0.9 MG/10ML-% IV SOSY
PREFILLED_SYRINGE | INTRAVENOUS | Status: DC | PRN
  Administered 2017-05-01: 5 mg via INTRAVENOUS

## 2017-05-01 MED ORDER — HYDROMORPHONE HCL 2 MG/ML IJ SOLN
INTRAMUSCULAR | Status: AC
Start: 2017-05-01 — End: 2017-05-01
  Filled 2017-05-01: qty 1

## 2017-05-01 MED ORDER — SODIUM CHLORIDE 0.9 % IR SOLN
Status: DC | PRN
  Administered 2017-05-01: 3000 mL

## 2017-05-01 MED ORDER — SODIUM CHLORIDE 0.9% FLUSH: 3.0000 mL | Freq: Two times a day (BID) | INTRAVENOUS | Status: DC

## 2017-05-01 MED ORDER — PROPOFOL 10 MG/ML IV BOLUS
INTRAVENOUS | Status: AC
  Filled 2017-05-01: qty 20

## 2017-05-01 MED ORDER — SODIUM CHLORIDE 0.9% FLUSH: 3.0000 mL | INTRAVENOUS | Status: DC | PRN

## 2017-05-01 MED ORDER — LACTATED RINGERS IV SOLN
INTRAVENOUS | Status: DC
  Administered 2017-05-01 (×4): via INTRAVENOUS

## 2017-05-01 MED ORDER — CEFAZOLIN SODIUM-DEXTROSE 2-4 GM/100ML-% IV SOLN
2.0000 g | INTRAVENOUS | Status: AC
  Administered 2017-05-01: 2 g via INTRAVENOUS
  Filled 2017-05-01 (×2): qty 100

## 2017-05-01 MED ORDER — IOHEXOL 300 MG/ML  SOLN
INTRAMUSCULAR | Status: DC | PRN
  Administered 2017-05-01: 1 mL

## 2017-05-01 MED ORDER — FENTANYL CITRATE (PF) 100 MCG/2ML IJ SOLN: 25.0000 ug | INTRAMUSCULAR | Status: DC | PRN

## 2017-05-01 MED ORDER — SUCCINYLCHOLINE CHLORIDE 200 MG/10ML IV SOSY
PREFILLED_SYRINGE | INTRAVENOUS | Status: DC | PRN
  Administered 2017-05-01: 120 mg via INTRAVENOUS

## 2017-05-01 MED ORDER — MORPHINE SULFATE (PF) 4 MG/ML IV SOLN: 2.0000 mg | INTRAVENOUS | Status: DC | PRN

## 2017-05-01 MED ORDER — LIDOCAINE 2% (20 MG/ML) 5 ML SYRINGE
INTRAMUSCULAR | Status: DC | PRN
  Administered 2017-05-01: 60 mg via INTRAVENOUS

## 2017-05-01 MED ORDER — OXYCODONE HCL 5 MG PO TABS: 5.0000 mg | ORAL_TABLET | ORAL | Status: DC | PRN

## 2017-05-01 MED ORDER — DEXAMETHASONE SODIUM PHOSPHATE 10 MG/ML IJ SOLN
INTRAMUSCULAR | Status: DC | PRN
  Administered 2017-05-01: 10 mg via INTRAVENOUS

## 2017-05-01 MED ORDER — SODIUM CHLORIDE 0.9 % IV SOLN: 250.0000 mL | INTRAVENOUS | Status: DC | PRN

## 2017-05-01 MED ORDER — TRAMADOL HCL 50 MG PO TABS: 50.0000 mg | ORAL_TABLET | Freq: Four times a day (QID) | ORAL | 0 refills | Status: AC | PRN

## 2017-05-01 MED ORDER — ACETAMINOPHEN 325 MG PO TABS: 650.0000 mg | ORAL_TABLET | ORAL | Status: DC | PRN

## 2017-05-01 MED ORDER — MIDAZOLAM HCL 2 MG/2ML IJ SOLN
INTRAMUSCULAR | Status: AC
  Filled 2017-05-01: qty 2

## 2017-05-01 MED ORDER — FENTANYL CITRATE (PF) 100 MCG/2ML IJ SOLN
INTRAMUSCULAR | Status: AC
  Filled 2017-05-01: qty 2

## 2017-05-01 SURGICAL SUPPLY — 30 items
BAG URO CATCHER STRL LF (MISCELLANEOUS) ×3 IMPLANT
BASKET STONE NCOMPASS (UROLOGICAL SUPPLIES) IMPLANT
CATH URET 5FR 28IN OPEN ENDED (CATHETERS) ×2 IMPLANT
CATH URET DUAL LUMEN 6-10FR 50 (CATHETERS) ×5 IMPLANT
CLOTH BEACON ORANGE TIMEOUT ST (SAFETY) ×3 IMPLANT
COVER FOOTSWITCH UNIV (MISCELLANEOUS) ×2 IMPLANT
COVER SURGICAL LIGHT HANDLE (MISCELLANEOUS) ×3 IMPLANT
EXTRACTOR STONE NITINOL NGAGE (UROLOGICAL SUPPLIES) ×5 IMPLANT
FIBER LASER FLEXIVA 1000 (UROLOGICAL SUPPLIES) IMPLANT
FIBER LASER FLEXIVA 365 (UROLOGICAL SUPPLIES) IMPLANT
FIBER LASER FLEXIVA 550 (UROLOGICAL SUPPLIES) IMPLANT
FIBER LASER TRAC TIP (UROLOGICAL SUPPLIES) IMPLANT
GLOVE BIOGEL PI IND STRL 6.5 (GLOVE) IMPLANT
GLOVE BIOGEL PI INDICATOR 6.5 (GLOVE) ×2
GLOVE SURG SS PI 8.0 STRL IVOR (GLOVE) ×2 IMPLANT
GOWN STRL REUS W/ TWL LRG LVL3 (GOWN DISPOSABLE) IMPLANT
GOWN STRL REUS W/TWL LRG LVL3 (GOWN DISPOSABLE) ×3
GOWN STRL REUS W/TWL XL LVL3 (GOWN DISPOSABLE) ×3 IMPLANT
GUIDEWIRE STR DUAL SENSOR (WIRE) ×3 IMPLANT
IV NS 1000ML (IV SOLUTION) ×3
IV NS 1000ML BAXH (IV SOLUTION) ×1 IMPLANT
IV NS IRRIG 3000ML ARTHROMATIC (IV SOLUTION) ×3 IMPLANT
MANIFOLD NEPTUNE II (INSTRUMENTS) ×3 IMPLANT
PACK CYSTO (CUSTOM PROCEDURE TRAY) ×3 IMPLANT
SHEATH ACCESS URETERAL 24CM (SHEATH) ×2 IMPLANT
SHEATH URETERAL 12FRX35CM (MISCELLANEOUS) ×3 IMPLANT
STENT URET 6FRX24 CONTOUR (STENTS) ×2 IMPLANT
TUBING CONNECTING 10 (TUBING) ×2 IMPLANT
TUBING CONNECTING 10' (TUBING) ×1
TUBING UROLOGY SET (TUBING) ×3 IMPLANT

## 2017-05-01 NOTE — Anesthesia Preprocedure Evaluation (Signed)
Anesthesia Evaluation  Patient identified by MRN, date of birth, ID band Patient awake    Reviewed: Allergy & Precautions, NPO status , Patient's Chart, lab work & pertinent test results  Airway Mallampati: II  TM Distance: >3 FB     Dental   Pulmonary former smoker,    breath sounds clear to auscultation       Cardiovascular  Rhythm:Regular Rate:Normal     Neuro/Psych    GI/Hepatic negative GI ROS, Neg liver ROS,   Endo/Other  negative endocrine ROS  Renal/GU negative Renal ROS     Musculoskeletal   Abdominal   Peds  Hematology   Anesthesia Other Findings   Reproductive/Obstetrics                             Anesthesia Physical Anesthesia Plan  ASA: III  Anesthesia Plan: General   Post-op Pain Management:    Induction: Intravenous  PONV Risk Score and Plan: 3 and Treatment may vary due to age or medical condition, Ondansetron, Dexamethasone and Midazolam  Airway Management Planned:   Additional Equipment:   Intra-op Plan:   Post-operative Plan: Extubation in OR  Informed Consent: I have reviewed the patients History and Physical, chart, labs and discussed the procedure including the risks, benefits and alternatives for the proposed anesthesia with the patient or authorized representative who has indicated his/her understanding and acceptance.   Dental advisory given  Plan Discussed with: CRNA and Anesthesiologist  Anesthesia Plan Comments:         Anesthesia Quick Evaluation

## 2017-05-01 NOTE — Anesthesia Postprocedure Evaluation (Signed)
Anesthesia Post Note  Patient: Christina [name]  Procedure(s) Performed: CYSTOSCOPY WITH RETROGRADE PYELOGRAM, RIGHT URETEROSCOPY AND STENT PLACEMENT (Right Ureter)     Patient location during evaluation: PACU Anesthesia Type: General Level of consciousness: awake Pain management: pain level controlled Vital Signs Assessment: post-procedure vital signs reviewed and stable Respiratory status: spontaneous breathing Cardiovascular status: stable Anesthetic complications: no    Last Vitals:  Vitals:   05/01/17 1930 05/01/17 1947  BP: 122/75 117/74  Pulse: (!) 59 67  Resp: 14 14  Temp: 37 C 36.9 C  SpO2: 99% 97%    Last Pain:  Vitals:   05/01/17 1900  TempSrc:   PainSc: Asleep                 Ethanael Veith

## 2017-05-01 NOTE — Discharge Instructions (Signed)

## 2017-05-01 NOTE — H&P (Signed)
CC: New Patient - Acute Kidney Stone  HPI: Christina [name] is a 37 year-old female patient who is here for further eval and management of kidney stones.  Here today for emergency department follow-up after having right-sided renal colic yesterday. This was evaluated in the emergency department, an ultrasound was performed noting right-sided hydronephrosis. No other additional imaging studies were performed. Urinalysis was cultured but not indicative of infection. Patient was recommended to follow up with urology. She was seen here last in 2014 having undergone a left ureteroscopy. She denies a stone event since then.   Today patient appears somewhat somnolent having been taking Percocet for pain but still states her right sided pain is severe and radiating from lower back into the right lower abdomen. She presents with her husband. Pt is afebrile. She does c/o frequent urination and a weakened stream. States not having seen gross hematuria, denies dysuria.    The patient presents today for follow-up after being [facility] for [facility]. The pain is on the right side. The patient relates initially having flank pain and groin pain. She is currently having flank pain and back pain. She denies having groin pain, nausea, vomiting, fever, and chills. This is not her first kidney stone. She has not caught a stone in her urine strainer since her symptoms began.   She has had Ureteral Stent and Ureteroscopy for treatment of her stones in the past.     ALLERGIES: Codeine Derivatives - tachycardia Mycins - Nausea, Vomiting    MEDICATIONS: Percocet  Wellbutrin Sr  Zofran  Zoloft     GU PSH: Cysto Uretero Lithotripsy - 2014 Cystoscopy Insert Stent - 2014      PSH Notes: Cystoscopy With Ureteroscopy With Lithotripsy, Cystoscopy With Insertion Of Ureteral Stent Left, Tonsillectomy   NON-GU PSH: Dental Surgery Procedure Remove Tonsils - 2014    GU PMH: Ureteral calculus,  Calculus of ureter - 2014 Renal calculus    NON-GU PMH: Anxiety, Anxiety (Symptom) - 2014 Personal history of other endocrine, nutritional and metabolic disease, History of hypercholesterolemia - 2014 Encounter for general adult medical examination without abnormal findings, Encounter for preventive health examination    FAMILY HISTORY: 3 daughters - Other Family Health Status Number - Runs In Family nephrolithiasis - Mother Patient's father is still living - Other patient's mother is still living - Other   SOCIAL HISTORY: Marital Status: Married Current Smoking Status: Patient does not smoke anymore. Has not smoked since 04/15/2007. Smoked for 10 years.   Tobacco Use Assessment Completed: Used Tobacco in last 30 days? Has never drank.  Drinks 1 caffeinated drink per day.     Notes: Former smoker, Alcohol Use, Occupation:, Caffeine Use, Marital History - Currently Married   REVIEW OF SYSTEMS:    GU Review Female:   Patient reports frequent urination. Patient denies hard to postpone urination, burning /pain with urination, get up at night to urinate, leakage of urine, stream starts and stops, trouble starting your stream, have to strain to urinate, and being pregnant.  Gastrointestinal (Upper):   Patient reports nausea. Patient denies vomiting and indigestion/ heartburn.  Gastrointestinal (Lower):   Patient denies diarrhea and constipation.  Constitutional:   Patient denies fever, night sweats, weight loss, and fatigue.  Skin:   Patient denies skin rash/ lesion and itching.  Eyes:   Patient denies blurred vision and double vision.  Ears/ Nose/ Throat:   Patient denies sore throat and sinus problems.  Hematologic/Lymphatic:   Patient  denies swollen glands and easy bruising.  Cardiovascular:   Patient denies leg swelling and chest pains.  Respiratory:   Patient denies cough and shortness of breath.  Endocrine:   Patient denies excessive thirst.  Musculoskeletal:   Patient reports back  pain. Patient denies joint pain.  Neurological:   Patient denies headaches and dizziness.  Psychologic:   Patient reports anxiety. Patient denies depression.   Notes: Blood in urine, Weak stream    VITAL SIGNS:      05/01/2017 10:15 AM  Weight 147 lb / 66.68 kg  Height 63 in / 160.02 cm  BP 96/67 mmHg  Pulse 58 /min  Temperature 98.0 F / 36.6 C  BMI 26.0 kg/m   MULTI-SYSTEM PHYSICAL EXAMINATION:    Constitutional: Well-nourished. No physical deformities. Normally developed. Good grooming.  Neck: Neck symmetrical, not swollen. Normal tracheal position.  Respiratory: No labored breathing, no use of accessory muscles. Normal breath sounds.  Cardiovascular: Regular rate and rhythm. No murmur, no gallop. Normal temperature, normal extremity pulses, no swelling, no varicosities.  Skin: No paleness, no jaundice, no cyanosis. No lesion, no ulcer, no rash.  Neurologic / Psychiatric: Oriented to time, oriented to place, oriented to person. No depression, no anxiety, no agitation.  Gastrointestinal: No mass, no tenderness, no rigidity, non obese abdomen. No palpable CVAT, flank or suprapubic tenderness.  Musculoskeletal: Normal gait and station of head and neck.     PAST DATA REVIEWED:  Source Of History:  Patient  Records Review:   Previous Hospital Records, Previous Patient Records  Urine Test Review:   Urinalysis  X-Ray Review: Renal Ultrasound: Reviewed Films. Reviewed Report. Hydro on the right with a non-obstructing renal calculi measuring around 5-666mm. No obvious left sided abnormalities noted. C.T. Stone Protocol: Reviewed Films. Discussed With Patient.     05/01/17  Urinalysis  Urine Appearance Turbid   Urine Color Yellow   Urine Glucose Neg   Urine Bilirubin Neg   Urine Ketones Trace   Urine Specific Gravity >1.030   Urine Blood Trace   Urine pH 5.5   Urine Protein 1+   Urine Urobilinogen 0.2   Urine Nitrites Neg   Urine Leukocyte Esterase 1+   Urine WBC/hpf 6 - 10/hpf    Urine RBC/hpf NS (Not Seen)   Urine Epithelial Cells 0 - 5/hpf   Urine Bacteria NS (Not Seen)   Urine Mucous Not Present   Urine Yeast NS (Not Seen)   Urine Trichomonas Not Present   Urine Cystals Amorph Urates   Urine Casts NS (Not Seen)   Urine Sperm Not Present    PROCEDURES:         C.T. Urogram - [id number]  Right Kidney/Ureter:  Solitary 2-683mm right renal stone. 3 mm ureteral stone. Lower 1/3 ureteral stone. Moderate hydronephrosis. Normal parenchyma. Moderate ureteral dilation.               Urinalysis w/Scope Dipstick Dipstick Cont'd Micro  Color: Yellow Bilirubin: Neg WBC/hpf: 6 - 10/hpf  Appearance: Turbid Ketones: Trace RBC/hpf: NS (Not Seen)  Specific Gravity: >1.030 Blood: Trace Bacteria: NS (Not Seen)  pH: 5.5 Protein: 1+ Cystals: Amorph Urates  Glucose: Neg Urobilinogen: 0.2 Casts: NS (Not Seen)    Nitrites: Neg Trichomonas: Not Present    Leukocyte Esterase: 1+ Mucous: Not Present      Epithelial Cells: 0 - 5/hpf      Yeast: NS (Not Seen)      Sperm: Not Present    Notes: MICROSCOPIC PERFORMED ON UNCONCENTRATED  URINE    ASSESSMENT:      ICD-10 Details  1 GU:   Renal calculus - N20.0 Right, Stable  2   Hydronephrosis - N13.0 Right  3   Ureteral calculus - N20.1 Right   PLAN:            Medications Stop Meds: Hydrocodone-Acetaminophen 5 mg-325 mg tablet 1 tablet PO Q 6 H PRN  Start: 05/01/2017  Discontinue: 05/01/2017  - Reason: The medication was entered incorrectly.            Orders Labs Urine Culture  X-Rays: C.T. Stone Protocol Without Contrast  X-Ray Notes: . History:  Hematuria: Yes/No  Patient to see MD after exam: Yes/No  Previous exam: CT / IVP/ US/ KUB/ None  When:  Where:  Diabetic: Yes/ No  BUN/ Creatinine:  Date of last BUN Creatinine:  Weight in pounds:  Allergy- IV Contrast: Yes/ No  Conflicting diabetic meds: Yes/ No  Diabetic Meds:  Prior Authorization #: [id number]           Schedule Return Visit/Planned  Activity: ASAP - Schedule Surgery          Document Letter(s):  Created for Patient: Clinical Summary         Notes:   Patient states pain only minimally controlled with prescribed oxycodone. I initially recommended attempting medical expulsive therapy given the stones distal location and size. She and her husband were unsure if symptoms could be continued to remain controlled even if adding tamsulosin and additional pain medication. She is requesting a definitive procedure be performed as soon as possible. She last had something to drink at 8am and has not had food today.   I spoke with the on-call urologist and he is agreeable to taking the patient for ureteroscopy this evening, she understands that if stone is unable to be visualized and treated, a ureteral stent will be placed to help relieve the noted hydronephrosis on imaging studies.  I have reviewed the risks of ureteroscopy with the patient including bleeding, infection, ureteral injury, need for a stent or secondary procedures, thrombotic events and anesthetic complications. I answered all questions to the best of my ability. She and her husband expressed understanding.        Next Appointment:      Next Appointment: 05/08/2017 10:15 AM    Appointment Type: Post Op NO Urine    Location: [facility] [phone]    Provider: Vianne Bulls    Reason for Visit: POST OP

## 2017-05-01 NOTE — Anesthesia Procedure Notes (Signed)
Procedure Name: Intubation Date/Time: 05/01/2017 6:07 PM Performed by: Minerva EndsMirarchi, Artin Mceuen M, CRNA Pre-anesthesia Checklist: Patient identified, Emergency Drugs available, Suction available and Patient being monitored Patient Re-evaluated:Patient Re-evaluated prior to induction Oxygen Delivery Method: Circle System Utilized Preoxygenation: Pre-oxygenation with 100% oxygen Induction Type: IV induction, Cricoid Pressure applied and Rapid sequence Ventilation: Mask ventilation without difficulty Laryngoscope Size: Miller and 2 Grade View: Grade I Tube type: Oral Number of attempts: 1 Airway Equipment and Method: Stylet Placement Confirmation: ETT inserted through vocal cords under direct vision,  positive ETCO2 and breath sounds checked- equal and bilateral Secured at: 21 cm Tube secured with: Tape Dental Injury: Teeth and Oropharynx as per pre-operative assessment  Comments: Smooth IV induction Green-- intubation AM CRNA atraumatic-- teeth and mouth as preop --- bilat BS Green

## 2017-05-01 NOTE — Op Note (Signed)
Procedure: 1.  Cystoscopy with right retrograde pyelogram and interpretation. 2.  Right ureteroscopic stone extraction with dilation of right distal ureteral stricture.  Preoperative diagnosis: Right distal ureteral stone.  Postoperative diagnosis: Same with right distal ureteral stricture.  Surgeon: Dr. Bjorn PippinJohn Shareta Fishbaugh.  Anesthesia: General.  Specimen: Stone.  Drain: 6 [place] by 24 cm contour double-J stent.  EBL: None.  Complications: None.  Indications: Christina [name] is a 37 year old white female with a history of stones who has a 2 mm right distal ureteral stone with intractable pain.  She is elected ureteroscopic management.  Procedure: She was given Ancef and was taken operating room where general anesthetic was induced.  She was placed in lithotomy position and fitted with PAS hose.  She was prepped with Betadine solution and draped in usual sterile fashion.  Cystoscopy was performed using a 23 [place] scope and 30 degree lens.  Examination revealed a normal urethra.  The bladder wall was smooth and pale without tumor stones or inflammation.  Ureteral orifices were unremarkable.  The right ureteral orifice was cannulated with a 5 French opening catheter and contrast was instilled.  The right retrograde pyelogram revealed a tight approximately 1/2-2 cm right distal ureteral stricture with a small filling defect consistent with a stone and proximal hydronephrosis.  A guidewire was then passed through the opening catheter and advanced to the kidney without difficulty.  The cystoscope was removed.  I attempted to pass the 4-1/2 [place] single-lumen semirigid ureteroscope alongside the wire however the lumen was too tight to accept the scope and the wire.  I then attempted to pass a 12 French introducer sheath inner core over the wire but was unable to advance it more than a centimeter.  Ureteroscope was then passed alongside the wire approximately 1 cm into the ureteral orifice but I was unable  to go further because of persistent stricture.  I attempted to pass the uret.  The bladder was drained and the cystoscope was removed.  She was taken down from lithotomy position, her anesthetic was reversed and she was moved to recovery room in stable condition.  There were no complications.  Eroscope over the wire but was also not able to successfully negotiate the stricture.  I then passed a 6 [place] opening catheter over the wire and this past to the proximal ureter.  This was then followed by an 8 [place] dual-lumen catheter which also passed without difficulty  I was then able to pass the ureteroscope alongside the wire and visualized the stone proximal to the area of stricturing.  However with a subsequent dilation that is been performed as I backed the scope out the stone flushed into the bladder.  Final inspection of the ureteroscope revealed disruption of the stricture with some mucosal tearing and it was felt a stent was indicated.  The cystoscope was then reinserted and the stone was evacuated from the bladder.  The cystoscope was then inserted over the wire and a 6 French 24 cm contour double-J stent without tether was passed to the kidney under fluoroscopic guidance.  The wire was removed leaving a good coil in the kidney and a good coil in the bladder

## 2017-05-01 NOTE — Transfer of Care (Signed)
Immediate Anesthesia Transfer of Care Note  Patient: Christina [name]  Procedure(s) Performed: CYSTOSCOPY WITH RETROGRADE PYELOGRAM, RIGHT URETEROSCOPY AND STENT PLACEMENT (Right Ureter)  Patient Location: PACU  Anesthesia Type:General  Level of Consciousness: sedated, patient cooperative and responds to stimulation  Airway & Oxygen Therapy: Patient Spontanous Breathing and Patient connected to face mask oxygen  Post-op Assessment: Report given to RN and Post -op Vital signs reviewed and stable  Post vital signs: Reviewed and stable  Last Vitals:  Vitals:   05/01/17 1740 05/01/17 1741  BP:    Pulse: 69 67  Resp: 12 15  Temp:    SpO2: 100% 100%    Last Pain:  Vitals:   05/01/17 1701  TempSrc:   PainSc: 2       Patients Stated Pain Goal: 4 (05/01/17 1648)  Complications: No apparent anesthesia complications

## 2017-05-01 NOTE — Progress Notes (Signed)
Patient sleeping with no issues noted at this time.

## 2017-05-02 ENCOUNTER — Encounter (HOSPITAL_COMMUNITY): Payer: Self-pay | Admitting: Urology

## 2017-05-02 LAB — URINE CULTURE: CULTURE: NO GROWTH

## 2019-02-18 ENCOUNTER — Ambulatory Visit: Payer: BC Managed Care – PPO | Attending: Internal Medicine

## 2019-02-18 DIAGNOSIS — Z20822 Contact with and (suspected) exposure to covid-19: Secondary | ICD-10-CM

## 2019-02-19 LAB — NOVEL CORONAVIRUS, NAA: SARS-CoV-2, NAA: NOT DETECTED

## 2019-11-23 IMAGING — US US RENAL
1 series · 14 of 25 positions shown · non-contrast
Comparison: CT scan March 13, 2012

CLINICAL DATA: Right-sided abdominal pain. Nausea. History of
left-sided stone.

EXAM:
RENAL / URINARY TRACT ULTRASOUND COMPLETE

[Series 1: us renal · 0.25mm/px · 14 of 33 slices shown]
[im 1/33]
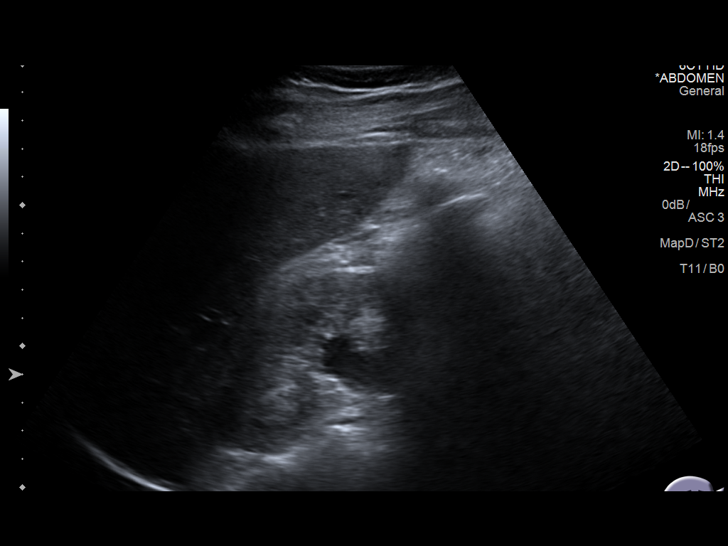
[im 3/33]
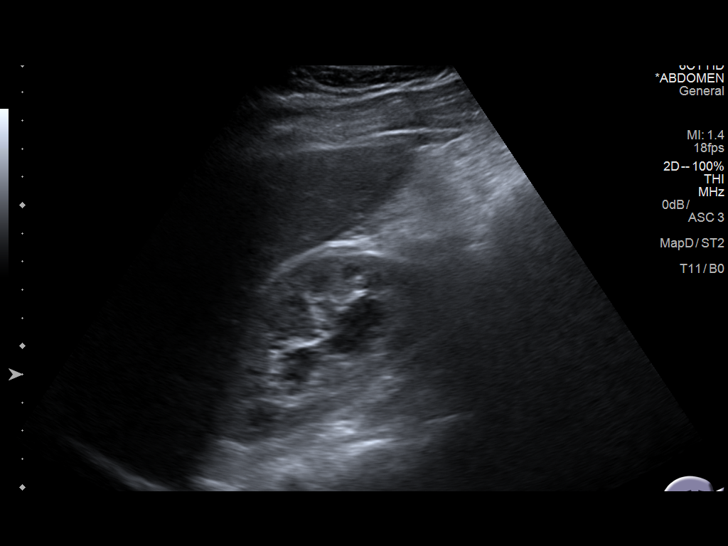
[im 6/33]
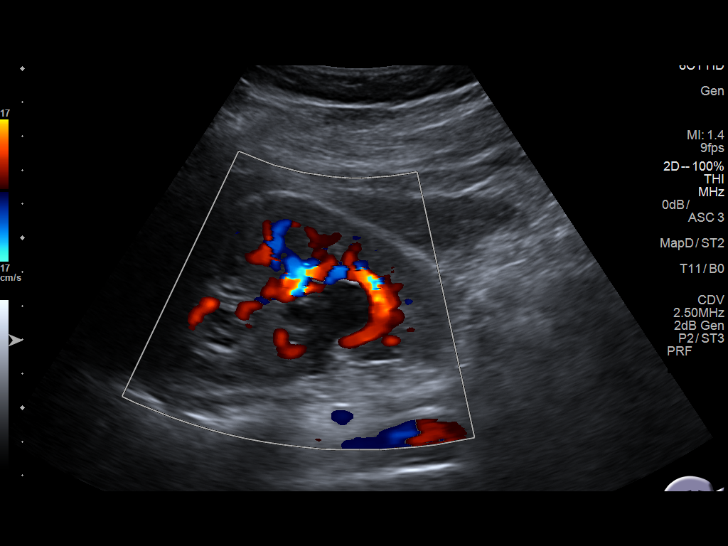
[im 9/33]
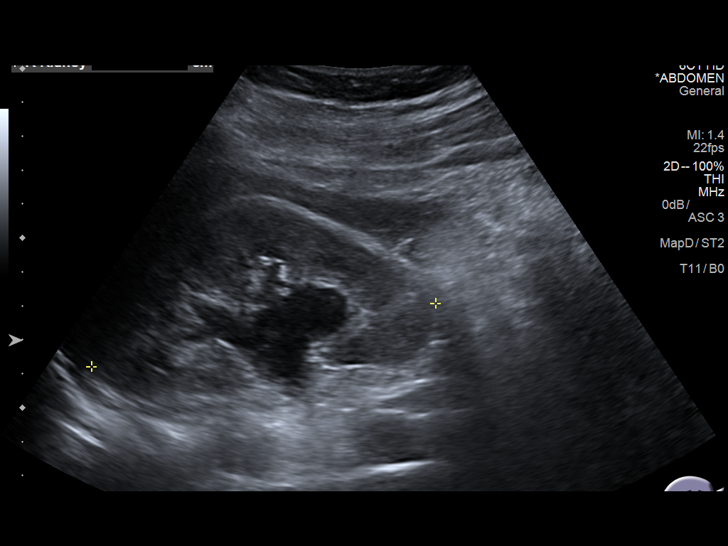
[im 11/33]
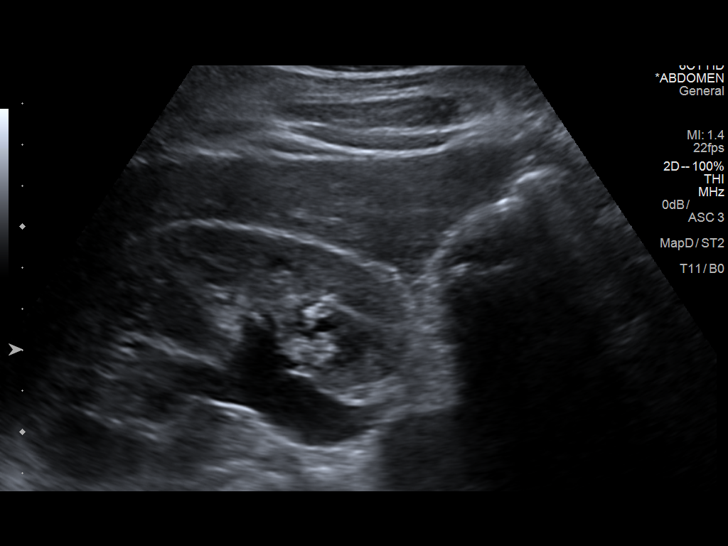
[im 13/33]
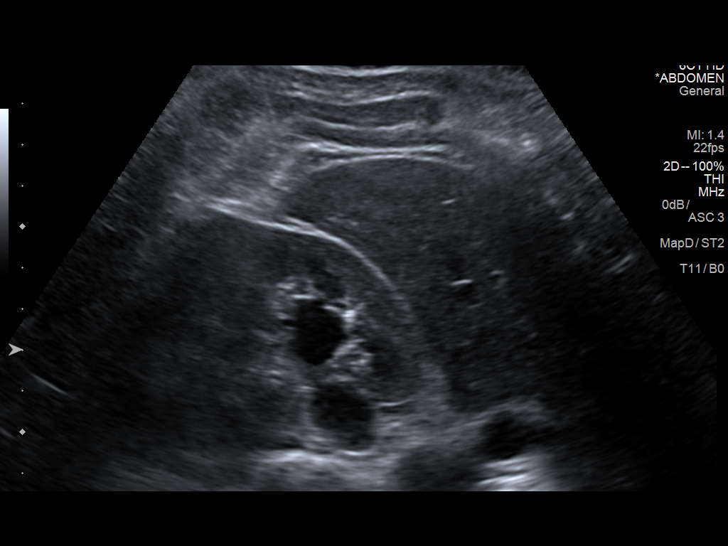
[im 15/33]
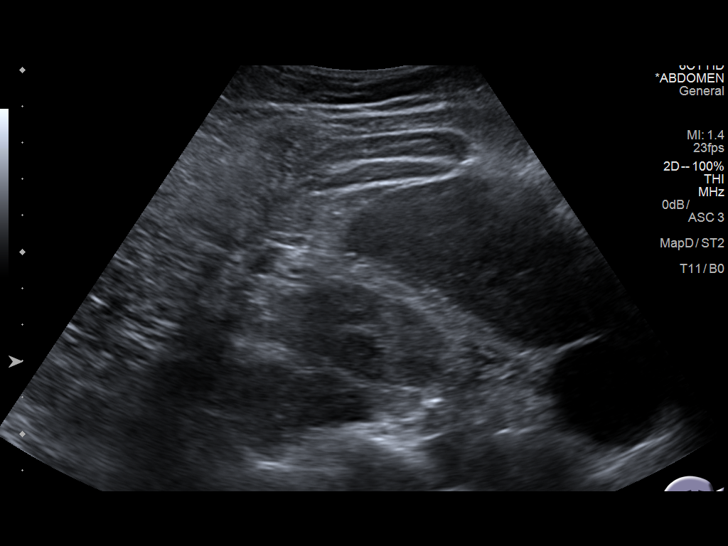
[im 18/33]
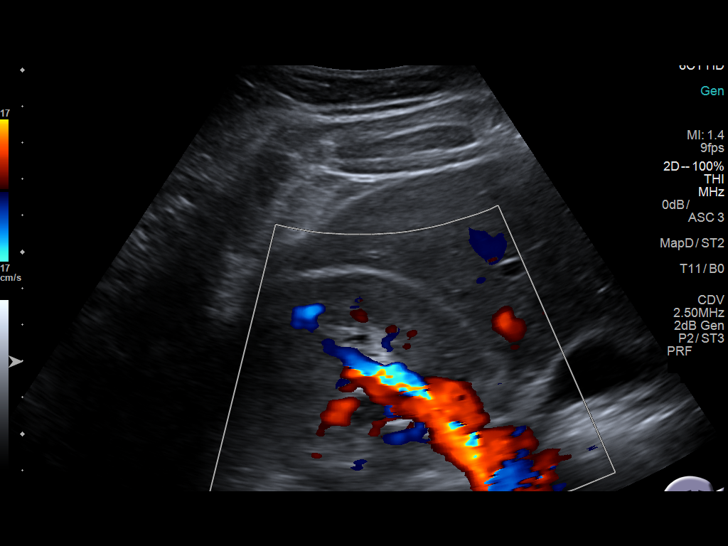
[im 21/33]
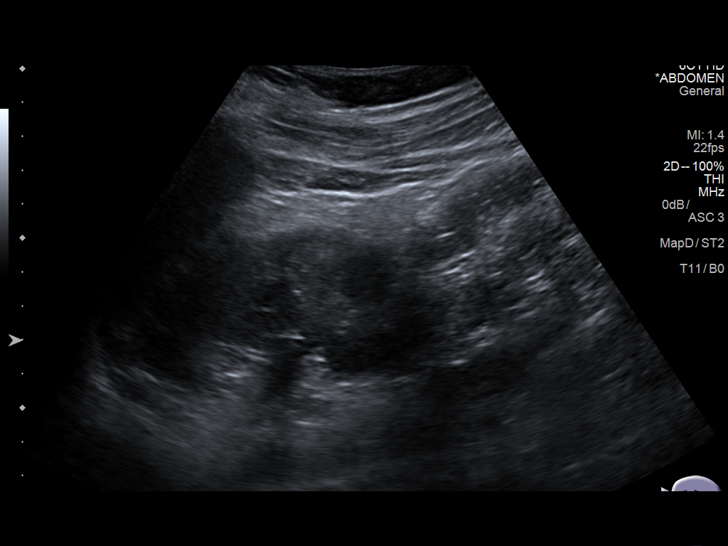
[im 22/33]
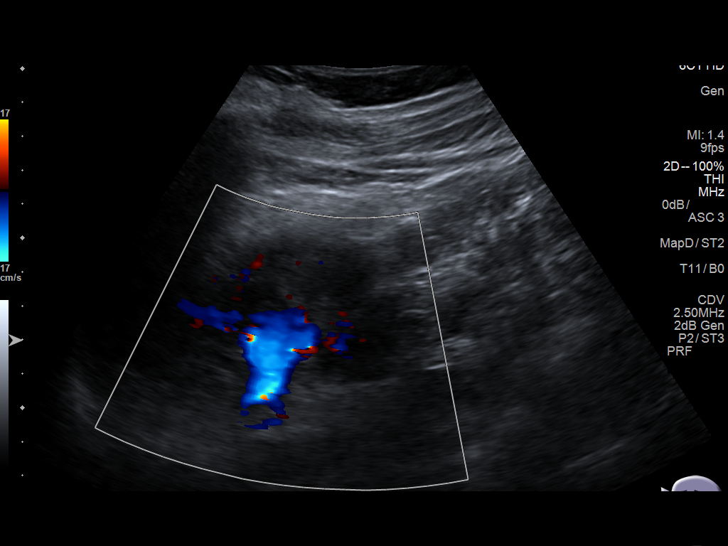
[im 25/33]
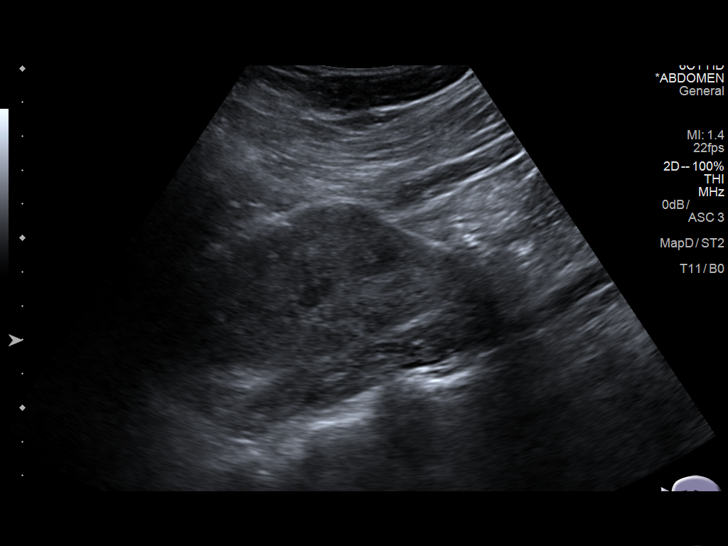
[im 27/33]
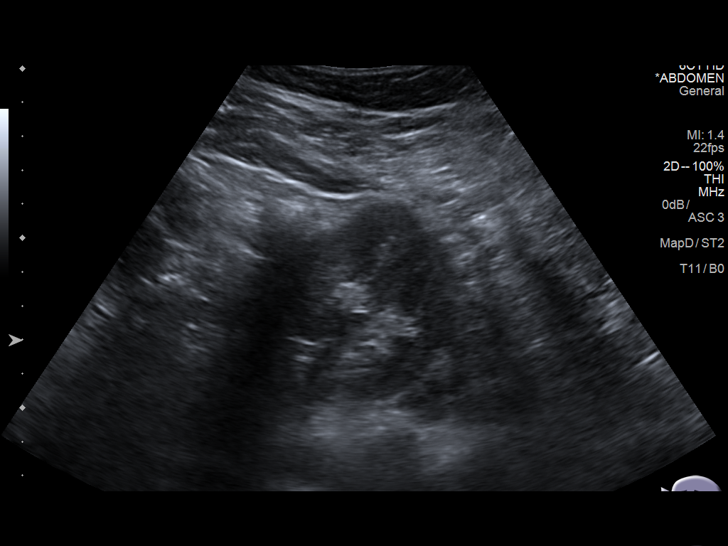
[im 30/33]
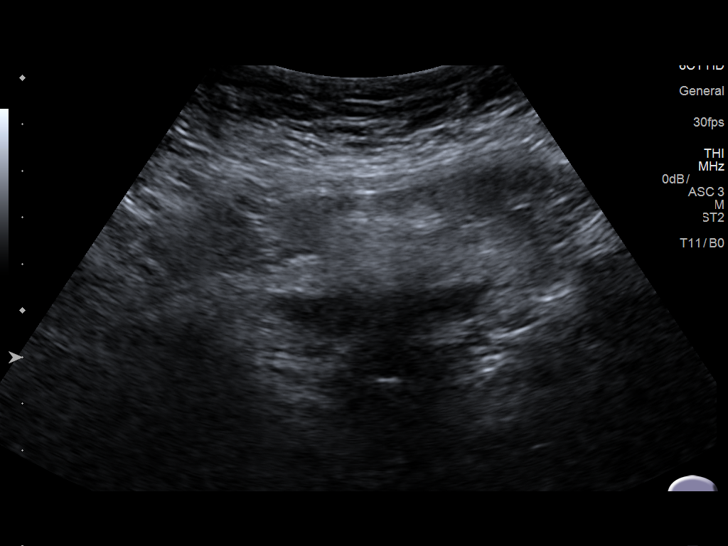
[im 33/33]
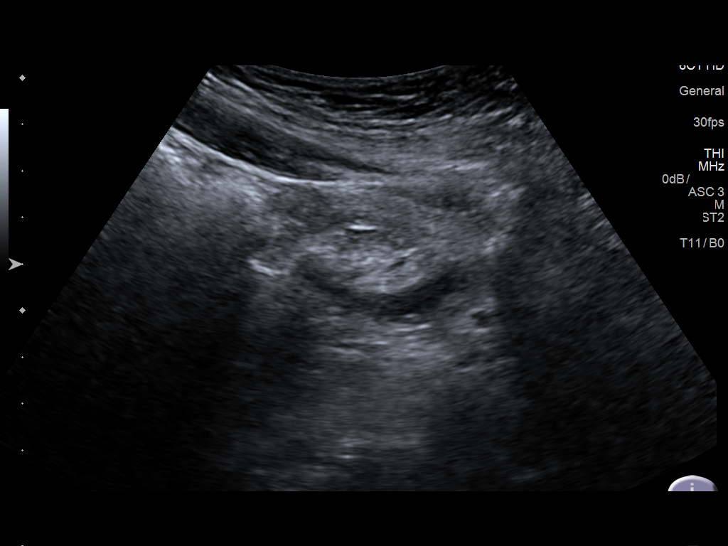

[14 of 25 positions shown; findings below may reference images not displayed]

FINDINGS: Right Kidney:

Length: 10.3 cm. There is a 6 mm stone in the right kidney. Moderate
hydronephrosis.

Left Kidney:

Length: 10 cm. Echogenicity within normal limits. No mass or
hydronephrosis visualized.

Bladder:

The bladder is decompressed limiting evaluation.
IMPRESSION: 1. Right moderate hydronephrosis. This finding was not seen on a CT
scan from 8754 but is otherwise age indeterminate. An acute ureteral
stone could result in this appearance in the setting of acute pain.
2. 6 mm stone in the right kidney.
3. The left kidney is normal.  The bladder is decompressed.
